# Patient Record
Sex: Female | Born: 1980 | Race: White | Hispanic: No | Marital: Married | Smoking: Former smoker
Health system: Southern US, Community
[De-identification: ages and names within clinical notes are randomized; demographics above are authoritative.]

## PROBLEM LIST (undated history)

## (undated) DIAGNOSIS — F419 Anxiety disorder, unspecified: Secondary | ICD-10-CM

## (undated) HISTORY — PX: DILATION AND CURETTAGE OF UTERUS: SHX78

## (undated) HISTORY — PX: BREAST ENHANCEMENT SURGERY: SHX7

---

## 2005-01-04 ENCOUNTER — Emergency Department (HOSPITAL_COMMUNITY): Admission: EM | Admit: 2005-01-04 | Discharge: 2005-01-04 | Payer: Self-pay | Admitting: Family Medicine

## 2005-03-08 ENCOUNTER — Inpatient Hospital Stay (HOSPITAL_COMMUNITY): Admission: AD | Admit: 2005-03-08 | Discharge: 2005-03-08 | Payer: Self-pay | Admitting: Family Medicine

## 2012-01-27 ENCOUNTER — Encounter (HOSPITAL_BASED_OUTPATIENT_CLINIC_OR_DEPARTMENT_OTHER): Payer: Self-pay | Admitting: *Deleted

## 2012-01-27 ENCOUNTER — Emergency Department (HOSPITAL_BASED_OUTPATIENT_CLINIC_OR_DEPARTMENT_OTHER)
Admission: EM | Admit: 2012-01-27 | Discharge: 2012-01-27 | Disposition: A | Discharge status: Home / Self Care | Payer: Self-pay | Attending: Emergency Medicine | Admitting: Emergency Medicine

## 2012-01-27 DIAGNOSIS — S161XXA Strain of muscle, fascia and tendon at neck level, initial encounter: Secondary | ICD-10-CM

## 2012-01-27 DIAGNOSIS — F411 Generalized anxiety disorder: Secondary | ICD-10-CM | POA: Insufficient documentation

## 2012-01-27 DIAGNOSIS — X58XXXA Exposure to other specified factors, initial encounter: Secondary | ICD-10-CM | POA: Insufficient documentation

## 2012-01-27 DIAGNOSIS — F419 Anxiety disorder, unspecified: Secondary | ICD-10-CM

## 2012-01-27 DIAGNOSIS — S139XXA Sprain of joints and ligaments of unspecified parts of neck, initial encounter: Secondary | ICD-10-CM | POA: Insufficient documentation

## 2012-01-27 NOTE — ED Provider Notes (Signed)
History     CSN: [id number]  Arrival date & time 01/27/12  1239   First MD Initiated Contact with Patient 01/27/12 1325      Chief Complaint  Patient presents with  . Shoulder Pain    starts in the L neck and goes into the L shoulder and L arm.      (Consider location/radiation/quality/duration/timing/severity/associated sxs/prior treatment) HPI Comments: Pt state that about 6 weeks ago she woke up with neck pain and was having a hard time turning her head from side to side:pt states that she went to a chiropractor multiple time and go no relief:pt states that she was seen at [facility] and had a ct of head and neck which were normal and they did lidocaine injections and oral meds on an outpt basis:pt states that after she had the injection she developed numbness in her left arm and shoulder:pt states that the numbness has resolved, but she has developed pain:pt denies fever:pt states that she has also been having anxiety problems:pt denies any need for further medication  The history is provided by the patient. No language interpreter was used.    No past medical history on file.  Past Surgical History  Procedure Date  . Breast enhancement surgery     2009    No family history on file.  History  Substance Use Topics  . Smoking status: Never Smoker   . Smokeless tobacco: Not on file  . Alcohol Use: No    OB History    Grav Para Term Preterm Abortions TAB SAB Ect Mult Living                  Review of Systems  All other systems reviewed and are negative.    Allergies  Review of patient's allergies indicates not on file.  Home Medications   Current Outpatient Rx  Name Route Sig Dispense Refill  . DIAZEPAM 5 MG PO TABS Oral Take 5 mg by mouth every 6 (six) hours as needed.    Marland Kitchen ESTRADIOL CYPIONATE 5 MG/ML IM OIL Intramuscular Inject into the muscle every 28 (twenty-eight) days.    Marland Kitchen NAPROXEN 125 MG/5ML PO SUSP Oral Take by mouth 2 (two) times daily.        BP 111/70  Pulse 108  Temp(Src) 98.2 F (36.8 C) (Oral)  Resp 17  Ht 5\' 2"  (1.575 m)  Wt 105 lb (47.628 kg)  BMI 19.20 kg/m2  SpO2 100%  Physical Exam  Nursing note and vitals reviewed. Constitutional: She is oriented to person, place, and time. She appears well-developed and well-nourished.  HENT:  Head: Normocephalic and atraumatic.  Eyes: Conjunctivae and EOM are normal.  Neck: Normal range of motion. Neck supple.  Cardiovascular: Normal rate and regular rhythm.   Pulmonary/Chest: Effort normal and breath sounds normal.  Musculoskeletal:       Pt has left paraspinal and rhomboid tenderness:grip strength is equal:no numbness noted  Neurological: She is oriented to person, place, and time. She exhibits abnormal muscle tone. Coordination normal.  Skin: Skin is warm and dry.  Psychiatric: She has a normal mood and affect.       Denies si/hi    ED Course  Procedures (including critical care time)  Labs Reviewed - No data to display No results found.   1. Cervical strain   2. Anxiety       MDM  Don't think further imaging is needed at this time:will send to ortho:pt refusing any further  medication:discussed anxiety with pt:pt states that she has a script for buspar, but she is unsure of taking the medication        Teressa Lower, NP 01/27/12 1405

## 2012-01-27 NOTE — Discharge Instructions (Signed)
Anxiety and Panic Attacks Your caregiver has informed you that you are having an anxiety or panic attack. There may be many forms of this. Most of the time these attacks come suddenly and without warning. They come at any time of day, including periods of sleep, and at any time of life. They may be strong and unexplained. Although panic attacks are very scary, they are physically harmless. Sometimes the cause of your anxiety is not known. Anxiety is a protective mechanism of the body in its fight or flight mechanism. Most of these perceived danger situations are actually nonphysical situations (such as anxiety over losing a job). CAUSES  The causes of an anxiety or panic attack are many. Panic attacks may occur in otherwise healthy people given a certain set of circumstances. There may be a genetic cause for panic attacks. Some medications may also have anxiety as a side effect. SYMPTOMS  Some of the most common feelings are:  Intense terror.   Dizziness, feeling faint.   Hot and cold flashes.   Fear of going crazy.   Feelings that nothing is real.   Sweating.   Shaking.   Chest pain or a fast heartbeat (palpitations).   Smothering, choking sensations.   Feelings of impending doom and that death is near.   Tingling of extremities, this may be from over-breathing.   Altered reality (derealization).   Being detached from yourself (depersonalization).  Several symptoms can be present to make up anxiety or panic attacks. DIAGNOSIS  The evaluation by your caregiver will depend on the type of symptoms you are experiencing. The diagnosis of anxiety or panic attack is made when no physical illness can be determined to be a cause of the symptoms. TREATMENT  Treatment to prevent anxiety and panic attacks may include:  Avoidance of circumstances that cause anxiety.   Reassurance and relaxation.   Regular exercise.   Relaxation therapies, such as yoga.   Psychotherapy with a  psychiatrist or therapist.   Avoidance of caffeine, alcohol and illegal drugs.   Prescribed medication.  SEEK IMMEDIATE MEDICAL CARE IF:   You experience panic attack symptoms that are different than your usual symptoms.   You have any worsening or concerning symptoms.  Document Released: 11/01/2005 Document Revised: 10/21/2011 Document Reviewed: 03/05/2010 [facility] Patient Information 2012 [place], [place].Cervical Sprain A cervical sprain is an injury in the neck in which the ligaments are stretched or torn. The ligaments are the tissues that hold the bones of the neck (vertebrae) in place.Cervical sprains can range from very mild to very severe. Most cervical sprains get better in 1 to 3 weeks, but it depends on the cause and extent of the injury. Severe cervical sprains can cause the neck vertebrae to be unstable. This can lead to damage of the spinal cord and can result in serious nervous system problems. Your caregiver will determine whether your cervical sprain is mild or severe. CAUSES  Severe cervical sprains may be caused by:  Contact sport injuries (football, rugby, wrestling, hockey, auto racing, gymnastics, diving, martial arts, boxing).   Motor vehicle collisions.   Whiplash injuries. This means the neck is forcefully whipped backward and forward.   Falls.  Mild cervical sprains may be caused by:   Awkward positions, such as cradling a telephone between your ear and shoulder.   Sitting in a chair that does not offer proper support.   Working at a poorly Marketing executive station.   Activities that require looking up or down for long  periods of time.  SYMPTOMS   Pain, soreness, stiffness, or a burning sensation in the front, back, or sides of the neck. This discomfort may develop immediately after injury or it may develop slowly and not begin for 24 hours or more after an injury.   Pain or tenderness directly in the middle of the back of the neck.   Shoulder or  upper back pain.   Limited ability to move the neck.   Headache.   Dizziness.   Weakness, numbness, or tingling in the hands or arms.   Muscle spasms.   Difficulty swallowing or chewing.   Tenderness and swelling of the neck.  DIAGNOSIS  Most of the time, your caregiver can diagnose this problem by taking your history and doing a physical exam. Your caregiver will ask about any known problems, such as arthritis in the neck or a previous neck injury. X-rays may be taken to find out if there are any other problems, such as problems with the bones of the neck. However, an X-ray often does not reveal the full extent of a cervical sprain. Other tests such as a computed tomography (CT) scan or magnetic resonance imaging (MRI) may be needed. TREATMENT  Treatment depends on the severity of the cervical sprain. Mild sprains can be treated with rest, keeping the neck in place (immobilization), and pain medicines. Severe cervical sprains need immediate immobilization and an appointment with an orthopedist or neurosurgeon. Several treatment options are available to help with pain, muscle spasms, and other symptoms. Your caregiver may prescribe:  Medicines, such as pain relievers, numbing medicines, or muscle relaxants.   Physical therapy. This can include stretching exercises, strengthening exercises, and posture training. Exercises and improved posture can help stabilize the neck, strengthen muscles, and help stop symptoms from returning.   A neck collar to be worn for short periods of time. Often, these collars are worn for comfort. However, certain collars may be worn to protect the neck and prevent further worsening of a serious cervical sprain.  HOME CARE INSTRUCTIONS   Put ice on the injured area.   Put ice in a plastic bag.   Place a towel between your skin and the bag.   Leave the ice on for 15 to 20 minutes, 3 to 4 times a day.   Only take over-the-counter or prescription medicines  for pain, discomfort, or fever as directed by your caregiver.   Keep all follow-up appointments as directed by your caregiver.   Keep all physical therapy appointments as directed by your caregiver.   If a neck collar is prescribed, wear it as directed by your caregiver.   Do not drive while wearing a neck collar.   Make any needed adjustments to your work station to promote good posture.   Avoid positions and activities that make your symptoms worse.   Warm up and stretch before being active to help prevent problems.  SEEK MEDICAL CARE IF:   Your pain is not controlled with medicine.   You are unable to decrease your pain medicine over time as planned.   Your activity level is not improving as expected.  SEEK IMMEDIATE MEDICAL CARE IF:   You develop any bleeding, stomach upset, or signs of an allergic reaction to your medicine.   Your symptoms get worse.   You develop new, unexplained symptoms.   You have numbness, tingling, weakness, or paralysis in any part of your body.  MAKE SURE YOU:   Understand these instructions.   Will  watch your condition.   Will get help right away if you are not doing well or get worse.  Document Released: 08/29/2007 Document Revised: 10/21/2011 Document Reviewed: 08/04/2011 [facility] Patient Information 2012 [place], [place].

## 2012-01-27 NOTE — ED Notes (Signed)
Pt. Reports she had numbness at one time but it is now gone now just reports pain in the L arm and L neck and L shoulder.

## 2012-02-03 ENCOUNTER — Other Ambulatory Visit: Payer: Self-pay

## 2012-02-03 ENCOUNTER — Emergency Department (INDEPENDENT_AMBULATORY_CARE_PROVIDER_SITE_OTHER): Payer: Self-pay

## 2012-02-03 ENCOUNTER — Encounter (HOSPITAL_BASED_OUTPATIENT_CLINIC_OR_DEPARTMENT_OTHER): Payer: Self-pay | Admitting: *Deleted

## 2012-02-03 ENCOUNTER — Emergency Department (HOSPITAL_BASED_OUTPATIENT_CLINIC_OR_DEPARTMENT_OTHER)
Admission: EM | Admit: 2012-02-03 | Discharge: 2012-02-04 | Disposition: A | Discharge status: Home / Self Care | Payer: Self-pay | Attending: Emergency Medicine | Admitting: Emergency Medicine

## 2012-02-03 DIAGNOSIS — R071 Chest pain on breathing: Secondary | ICD-10-CM | POA: Insufficient documentation

## 2012-02-03 DIAGNOSIS — R05 Cough: Secondary | ICD-10-CM

## 2012-02-03 DIAGNOSIS — R079 Chest pain, unspecified: Secondary | ICD-10-CM

## 2012-02-03 DIAGNOSIS — R0789 Other chest pain: Secondary | ICD-10-CM

## 2012-02-03 LAB — DIFFERENTIAL
Lymphs Abs: 3.6 10*3/uL (ref 0.7–4.0)
Monocytes Absolute: 0.6 10*3/uL (ref 0.1–1.0)
Monocytes Relative: 5 % (ref 3–12)
Neutro Abs: 7.2 10*3/uL (ref 1.7–7.7)
Neutrophils Relative %: 63 % (ref 43–77)

## 2012-02-03 LAB — CBC
HCT: 39.2 % (ref 36.0–46.0)
WBC: 11.4 10*3/uL — ABNORMAL HIGH (ref 4.0–10.5)

## 2012-02-03 LAB — PREGNANCY, URINE: Preg Test, Ur: NEGATIVE

## 2012-02-03 LAB — BASIC METABOLIC PANEL
BUN: 8 mg/dL (ref 6–23)
CO2: 24 mEq/L (ref 19–32)
Calcium: 9.9 mg/dL (ref 8.4–10.5)
Chloride: 105 mEq/L (ref 96–112)
GFR calc non Af Amer: >90 mL/min (ref >90)
Glucose, Bld: 99 mg/dL (ref 70–99)
Potassium: 3.3 mEq/L — ABNORMAL LOW (ref 3.5–5.1)

## 2012-02-03 NOTE — ED Notes (Signed)
Dr Nicanor Alcon at bedside.

## 2012-02-03 NOTE — ED Notes (Signed)
C/o chest pain this afternoon while walking. Pain worsens with movement and when she lays flat. Denies SOB.

## 2012-02-03 NOTE — ED Notes (Signed)
Pt c/o cold/cough symptoms xseveral days. C/o severe episodes of coughing yesterday. Today began having midsternal chest pains that worsen with coughing, movement and stretching. Denies meds PTA. Denies associated symptoms. Pt does not appear to be in any acute distress while at rest. Pt states her pain worsened during exam while taking a deep breathing and stretching.

## 2012-02-04 MED ORDER — IBUPROFEN 600 MG PO TABS: 600.0000 mg | ORAL_TABLET | Freq: Four times a day (QID) | ORAL | Status: AC | PRN

## 2012-02-04 NOTE — ED Provider Notes (Signed)
History     CSN: [id number]  Arrival date & time 02/03/12  1955   First MD Initiated Contact with Patient 02/03/12 2256      Chief Complaint  Patient presents with  . Chest Pain    (Consider location/radiation/quality/duration/timing/severity/associated sxs/prior treatment) Patient is a 31 y.o. female presenting with chest pain. The history is provided by the patient. No language interpreter was used.  Chest Pain The chest pain began 6 - 12 hours ago. Chest pain occurs constantly. The chest pain is unchanged. The pain is associated with coughing. At its most intense, the pain is at 9/10. The quality of the pain is described as pleuritic. The pain radiates to the upper back. Chest pain is worsened by certain positions. Primary symptoms include cough. Pertinent negatives for primary symptoms include no fever, no wheezing, no nausea and no vomiting.  Pertinent negatives for associated symptoms include no claudication, no diaphoresis, no lower extremity edema and no near-syncope. She tried nothing for the symptoms. Risk factors include no known risk factors.  Pertinent negatives for past medical history include no MI.  Procedure history is negative for cardiac catheterization.     History reviewed. No pertinent past medical history.  Past Surgical History  Procedure Date  . Breast enhancement surgery     2009    History reviewed. No pertinent family history.  History  Substance Use Topics  . Smoking status: Never Smoker   . Smokeless tobacco: Not on file  . Alcohol Use: No    OB History    Grav Para Term Preterm Abortions TAB SAB Ect Mult Living                  Review of Systems  Constitutional: Negative for fever and diaphoresis.  HENT: Negative.   Eyes: Negative.   Respiratory: Positive for cough. Negative for wheezing.   Cardiovascular: Positive for chest pain. Negative for claudication and near-syncope.  Gastrointestinal: Negative for nausea and vomiting.    Genitourinary: Negative.   Musculoskeletal: Negative.   Neurological: Negative.   Hematological: Negative.   Psychiatric/Behavioral: Negative.     Allergies  Review of patient's allergies indicates no known allergies.  Home Medications   Current Outpatient Rx  Name Route Sig Dispense Refill  . BUSPIRONE HCL 5 MG PO TABS Oral Take 5 mg by mouth 3 (three) times daily. Patient takes 2.5 mg in the am, and 2.5 mg in the pm.  Patient called physician and the doctor told her to discontinue the medication today.    Marland Kitchen ESTRADIOL CYPIONATE 5 MG/ML IM OIL Intramuscular Inject into the muscle every 28 (twenty-eight) days.    Marland Kitchen DIAZEPAM 5 MG PO TABS Oral Take 5 mg by mouth every 6 (six) hours as needed.    . IBUPROFEN 600 MG PO TABS Oral Take 1 tablet (600 mg total) by mouth every 6 (six) hours as needed for pain. 30 tablet 0  . NAPROXEN 125 MG/5ML PO SUSP Oral Take by mouth 2 (two) times daily.      BP 109/71  Pulse 78  Temp(Src) 98.2 F (36.8 C) (Oral)  Resp 18  Ht 5\' 2"  (1.575 m)  Wt 105 lb (47.628 kg)  BMI 19.20 kg/m2  SpO2 100%  Physical Exam  Constitutional: She is oriented to person, place, and time. She appears well-developed and well-nourished.  HENT:  Head: Normocephalic and atraumatic.  Mouth/Throat: Oropharynx is clear and moist.  Eyes: Conjunctivae are normal. Pupils are equal, round, and reactive to light.  Neck: Normal range of motion. Neck supple.  Cardiovascular: Normal rate and regular rhythm.   Pulmonary/Chest: Effort normal and breath sounds normal. She has no wheezes.  Abdominal: Soft. Bowel sounds are normal. There is no tenderness. There is no rebound and no guarding.  Musculoskeletal: Normal range of motion.  Neurological: She is alert and oriented to person, place, and time.  Skin: Skin is warm and dry. She is not diaphoretic.  Psychiatric: She has a normal mood and affect.    ED Course  Procedures (including critical care time)  Labs Reviewed  CBC -  Abnormal; Notable for the following:    WBC 11.4 (*)    All other components within normal limits  BASIC METABOLIC PANEL - Abnormal; Notable for the following:    Potassium 3.3 (*)    All other components within normal limits  PREGNANCY, URINE  DIFFERENTIAL  CARDIAC PANEL(CRET KIN+CKTOT+MB+TROPI)  D-DIMER, QUANTITATIVE   Dg Chest 2 View  02/04/2012  *RADIOLOGY REPORT*  Clinical Data: Mid chest pain; cough.  CHEST - 2 VIEW  Comparison: None.  Findings: The lungs are well-aerated.  A likely small calcified granuloma is noted at the left midlung zone.  There is no evidence of focal opacification, pleural effusion or pneumothorax.  The heart is normal in size; the mediastinal contour is within normal limits.  No acute osseous abnormalities are seen.  IMPRESSION: No acute cardiopulmonary process seen.  Original Report Authenticated By: Tonia Ghent, M.D.     1. Pain, chest wall       MDM   Date: 02/04/2012  Rate: 88  Rhythm: sinus arrhythmia  QRS Axis: normal  Intervals: normal  ST/T Wave abnormalities: nonspecific ST changes  Conduction Disutrbances:none  Narrative Interpretation:   Old EKG Reviewed: none available          Ella Golomb K Harmony Sandell-Rasch, MD 02/04/12 [phone]

## 2012-02-04 NOTE — Discharge Instructions (Signed)
Chest Pain Observation  It is often hard to give a specific diagnosis for the cause of chest pain. Your symptoms had a chance of being caused by inadequate oxygen delivery to your heart (angina). Angina that is not treated or evaluated can lead to a heart attack (myocardial infarction, MI) or death.  Blood tests, electrocardiograms, and X-rays may have been done to help determine a possible cause of your chest pain. After evaluation and observation, your caregiver has determined that it is unlikely your pain was caused by angina. However, a full evaluation of your pain needs to be completed. You need to follow up with caregivers or diagnostic testing as directed. It is very important to keep your follow-up appointments. Not keeping your follow-up appointments could result in permanent heart damage, disability, or death. If there is any problem keeping your follow-up appointments, you must call your caregiver.  HOME CARE INSTRUCTIONS   Due to the slight chance that your pain could be angina, it is important to follow healthy lifestyle habits and follow your caregiver's treatment plan:   Maintain a healthy weight.   Stay physically active and exercise regularly.   Decrease your salt intake.   Eat a diet low in saturated fats and cholesterol. Avoid foods fried in oil or made with fat. Talk to a dietician to learn about heart healthy foods.   Increase your fiber intake by including whole grains, vegetable, and fruits in your diet.   Avoid situations that cause stress, anger, or depression.   Take medication as advised by your caregiver. Report any side effects to your caregiver. Do not stop medications or adjust the dosages on your own.   Quit smoking. Do not use nicotine patches or gum until you check with your caregiver.   Keep your blood pressure, blood sugar, and cholesterol levels within normal limits.   Limit alcohol intake to no more than 1 drink per day for nonpregnant women and 2 drinks per day for  men.   Stop abusing drugs.  SEEK MEDICAL CARE IF:  You have severe chest pain or pressure which may include symptoms such as:   Pain or pressure in the arms, neck, jaw, or back.   Profuse sweating.   Feeling sick to your stomach (nauseous).   Feeling short of breath while at rest.   Having a fast or irregular heartbeat.   You have chest pain that does not get better after rest or after taking your usual medicine.   You wake from sleep with chest pain.   You feel dizzy, faint, or experience extreme fatigue.   You notice increasing shortness of breath during rest, sleep, or with activity.   You are unable to sleep because you cannot breathe.   You develop a frequent cough or you are coughing up blood.   You have severe back or abdominal pain, are nauseated, or throw up (vomit).   You develop severe weakness, dizziness, fainting, or chills.  Any of these symptoms may represent a serious problem that is an emergency. Do not wait to see if the symptoms will go away. Call your local emergency services (911 in the [place] Do not drive yourself to the hospital.  MAKE SURE YOU:   Understand these instructions.   Will watch your condition.   Will get help right away if you are not doing well or get worse.  Document Released: 12/04/2010 Document Revised: 10/21/2011 Document Reviewed: 12/04/2010  [employer] Patient Information 2012 [employer]

## 2012-02-06 ENCOUNTER — Emergency Department (INDEPENDENT_AMBULATORY_CARE_PROVIDER_SITE_OTHER): Payer: Self-pay

## 2012-02-06 ENCOUNTER — Encounter (HOSPITAL_BASED_OUTPATIENT_CLINIC_OR_DEPARTMENT_OTHER): Payer: Self-pay

## 2012-02-06 ENCOUNTER — Emergency Department (HOSPITAL_BASED_OUTPATIENT_CLINIC_OR_DEPARTMENT_OTHER)
Admission: EM | Admit: 2012-02-06 | Discharge: 2012-02-06 | Disposition: A | Discharge status: Home Health | Payer: Self-pay | Attending: Emergency Medicine | Admitting: Emergency Medicine

## 2012-02-06 DIAGNOSIS — M545 Low back pain, unspecified: Secondary | ICD-10-CM | POA: Insufficient documentation

## 2012-02-06 DIAGNOSIS — M549 Dorsalgia, unspecified: Secondary | ICD-10-CM

## 2012-02-06 DIAGNOSIS — M546 Pain in thoracic spine: Secondary | ICD-10-CM | POA: Insufficient documentation

## 2012-02-06 DIAGNOSIS — M79609 Pain in unspecified limb: Secondary | ICD-10-CM

## 2012-02-06 LAB — URINALYSIS, ROUTINE W REFLEX MICROSCOPIC
Glucose, UA: NEGATIVE mg/dL
Nitrite: NEGATIVE
Protein, ur: NEGATIVE mg/dL
Specific Gravity, Urine: 1.021 (ref 1.005–1.030)
pH: 5.5 (ref 5.0–8.0)

## 2012-02-06 LAB — URINE MICROSCOPIC-ADD ON

## 2012-02-06 MED ORDER — CYCLOBENZAPRINE HCL 10 MG PO TABS: 5.0000 mg | ORAL_TABLET | Freq: Two times a day (BID) | ORAL | Status: AC | PRN

## 2012-02-06 MED ORDER — CYCLOBENZAPRINE HCL 10 MG PO TABS
5.0000 mg | ORAL_TABLET | Freq: Once | ORAL | Status: AC
  Administered 2012-02-06: 09:00:00 via ORAL
  Filled 2012-02-06: qty 1

## 2012-02-06 NOTE — Discharge Instructions (Signed)

## 2012-02-06 NOTE — ED Provider Notes (Signed)
History     CSN: [id number]  Arrival date & time 02/06/12  4098   First MD Initiated Contact with Patient 02/06/12 [phone]      Chief Complaint  Patient presents with  . Back Pain    (Consider location/radiation/quality/duration/timing/severity/associated sxs/prior treatment) HPI Patient states that she had bilateral low back pain that began yesterday and then began having pain in her bilateral upper back. She's had no trauma. She states it was worse during the night and after she slept. Pain increases with certain movements. She has not had any increased activity or trauma. She has not short of breath or having chest pain. She's not had a nausea vomiting, diarrhea, or UTI symptoms. History reviewed. No pertinent past medical history.  Past Surgical History  Procedure Date  . Breast enhancement surgery     2009    No family history on file.  History  Substance Use Topics  . Smoking status: Never Smoker   . Smokeless tobacco: Not on file  . Alcohol Use: No    OB History    Grav Para Term Preterm Abortions TAB SAB Ect Mult Living                  Review of Systems  All other systems reviewed and are negative.    Allergies  Review of patient's allergies indicates no known allergies.  Home Medications   Current Outpatient Rx  Name Route Sig Dispense Refill  . BUSPIRONE HCL 5 MG PO TABS Oral Take 5 mg by mouth 3 (three) times daily. Patient takes 2.5 mg in the am, and 2.5 mg in the pm.  Patient called physician and the doctor told her to discontinue the medication today.    Marland Kitchen DIAZEPAM 5 MG PO TABS Oral Take 5 mg by mouth every 6 (six) hours as needed.    Marland Kitchen ESTRADIOL CYPIONATE 5 MG/ML IM OIL Intramuscular Inject into the muscle every 28 (twenty-eight) days.    . IBUPROFEN 600 MG PO TABS Oral Take 1 tablet (600 mg total) by mouth every 6 (six) hours as needed for pain. 30 tablet 0  . NAPROXEN 125 MG/5ML PO SUSP Oral Take by mouth 2 (two) times daily.      BP 128/87   Pulse 80  Temp(Src) 98.5 F (36.9 C) (Oral)  Resp 16  Ht 5\' 2"  (1.575 m)  Wt 105 lb (47.628 kg)  BMI 19.20 kg/m2  SpO2 100%  Physical Exam  Nursing note and vitals reviewed. Constitutional: She appears well-developed and well-nourished.  HENT:  Head: Normocephalic and atraumatic.  Eyes: Conjunctivae and EOM are normal. Pupils are equal, round, and reactive to light.  Neck: Normal range of motion. Neck supple.  Cardiovascular: Normal rate, regular rhythm, normal heart sounds and intact distal pulses.   Pulmonary/Chest: Effort normal and breath sounds normal.  Abdominal: Soft. Bowel sounds are normal.  Musculoskeletal: Normal range of motion.       Diffuse tenderness over trapezius and bilateral paralumbar spinal tenderness  Neurological: She is alert.  Skin: Skin is warm and dry.  Psychiatric: She has a normal mood and affect. Thought content normal.    ED Course  Procedures (including critical care time)  Labs Reviewed  URINALYSIS, ROUTINE W REFLEX MICROSCOPIC - Abnormal; Notable for the following:    Hgb urine dipstick MODERATE (*)    Ketones, ur 15 (*)    All other components within normal limits  URINE MICROSCOPIC-ADD ON - Abnormal; Notable for the following:  Bacteria, UA MANY (*)    Casts HYALINE CASTS (*)    All other components within normal limits  PREGNANCY, URINE   Dg Chest 2 View  02/06/2012  *RADIOLOGY REPORT*  Clinical Data: Upper back pain which radiates down her left arm  CHEST - 2 VIEW  Comparison: 02/03/2012  Findings: Normal cardiac silhouette and mediastinal contours.  No focal parenchymal opacities.  No pleural effusion or pneumothorax. No acute osseous abnormalities.  IMPRESSION: No acute cardiopulmonary disease.  Original Report Authenticated By: Waynard Reeds, M.D.     No diagnosis found.    MDM   Results for orders placed during the hospital encounter of 02/06/12  URINALYSIS, ROUTINE W REFLEX MICROSCOPIC      Component Value Range    Color, Urine YELLOW  YELLOW    APPearance CLEAR  CLEAR    Specific Gravity, Urine 1.021  1.005 - 1.030    pH 5.5  5.0 - 8.0    Glucose, UA NEGATIVE  NEGATIVE (mg/dL)   Hgb urine dipstick MODERATE (*) NEGATIVE    Bilirubin Urine NEGATIVE  NEGATIVE    Ketones, ur 15 (*) NEGATIVE (mg/dL)   Protein, ur NEGATIVE  NEGATIVE (mg/dL)   Urobilinogen, UA 0.2  0.0 - 1.0 (mg/dL)   Nitrite NEGATIVE  NEGATIVE    Leukocytes, UA NEGATIVE  NEGATIVE   PREGNANCY, URINE      Component Value Range   Preg Test, Ur NEGATIVE  NEGATIVE   URINE MICROSCOPIC-ADD ON      Component Value Range   Squamous Epithelial / LPF RARE  RARE    WBC, UA 0-2  <3 (WBC/hpf)   RBC / HPF 3-6  <3 (RBC/hpf)   Bacteria, UA MANY (*) RARE    Casts HYALINE CASTS (*) NEGATIVE    Urine-Other MUCOUS PRESENT     Patient with bacteria in urine but only 0-2 white blood cells and LE negative. This was a clean catch sample so I suspect this is a contaminated specimen. She'll be given a prescription for Flexeril and advised of primary care Dr.        Hilario Quarry, MD 02/06/12 1105

## 2012-02-06 NOTE — ED Notes (Signed)
Pt reports back pain that started yesterday.  Denies injury. 

## 2012-02-06 NOTE — ED Notes (Signed)
MD at bedside. 

## 2012-02-09 ENCOUNTER — Encounter (HOSPITAL_BASED_OUTPATIENT_CLINIC_OR_DEPARTMENT_OTHER): Payer: Self-pay | Admitting: *Deleted

## 2012-02-09 ENCOUNTER — Emergency Department (HOSPITAL_BASED_OUTPATIENT_CLINIC_OR_DEPARTMENT_OTHER)
Admission: EM | Admit: 2012-02-09 | Discharge: 2012-02-09 | Disposition: A | Discharge status: Home / Self Care | Payer: Self-pay | Attending: Emergency Medicine | Admitting: Emergency Medicine

## 2012-02-09 DIAGNOSIS — M542 Cervicalgia: Secondary | ICD-10-CM | POA: Insufficient documentation

## 2012-02-09 DIAGNOSIS — F411 Generalized anxiety disorder: Secondary | ICD-10-CM | POA: Insufficient documentation

## 2012-02-09 HISTORY — DX: Anxiety disorder, unspecified: F41.9

## 2012-02-09 MED ORDER — IBUPROFEN 600 MG PO TABS: 600.0000 mg | ORAL_TABLET | Freq: Four times a day (QID) | ORAL | Status: AC | PRN

## 2012-02-09 NOTE — ED Provider Notes (Signed)
History     CSN: [id number]  Arrival date & time 02/09/12  1308   First MD Initiated Contact with Patient 02/09/12 1330      Chief Complaint  Patient presents with  . Shoulder Pain    Left shoulder and L neck pain for several days per Pt.     The history is provided by the patient.  location - left neck onset - today Course - stable Worsened by - movement/palpation Improved by rest Associated symptoms - pain in left UE/hand  Pt reports pain in left neck that radiates to her left hand - reports mostly to her left 4th fingers No weakness reported No trauma reported No CP/SOB at this time No leg weakness No back pain   Past Medical History  Diagnosis Date  . Anxiety     Past Surgical History  Procedure Date  . Breast enhancement surgery     2009    No family history on file.  History  Substance Use Topics  . Smoking status: Never Smoker   . Smokeless tobacco: Not on file  . Alcohol Use: No    OB History    Grav Para Term Preterm Abortions TAB SAB Ect Mult Living                  Review of Systems  Constitutional: Negative for fever.  Respiratory: Negative for shortness of breath.     Allergies  Review of patient's allergies indicates no known allergies.  Home Medications   Current Outpatient Rx  Name Route Sig Dispense Refill  . BUSPIRONE HCL 5 MG PO TABS Oral Take 5 mg by mouth 3 (three) times daily. Patient takes 2.5 mg in the am, and 2.5 mg in the pm.  Patient called physician and the doctor told her to discontinue the medication today.    . CYCLOBENZAPRINE HCL 10 MG PO TABS Oral Take 0.5 tablets (5 mg total) by mouth 2 (two) times daily as needed for muscle spasms. 20 tablet 0  . DIAZEPAM 5 MG PO TABS Oral Take 5 mg by mouth every 6 (six) hours as needed.    Marland Kitchen ESTRADIOL CYPIONATE 5 MG/ML IM OIL Intramuscular Inject into the muscle every 28 (twenty-eight) days.    . IBUPROFEN 600 MG PO TABS Oral Take 1 tablet (600 mg total) by mouth every 6  (six) hours as needed for pain. 30 tablet 0  . IBUPROFEN 600 MG PO TABS Oral Take 1 tablet (600 mg total) by mouth every 6 (six) hours as needed for pain. 30 tablet 0  . NAPROXEN 125 MG/5ML PO SUSP Oral Take by mouth 2 (two) times daily.      BP 118/73  Pulse 99  Temp(Src) 98.4 F (36.9 C) (Oral)  Resp 16  SpO2 100%  Physical Exam CONSTITUTIONAL: Well developed/well nourished HEAD AND FACE: Normocephalic/atraumatic EYES: EOMI/PERRL ENMT: Mucous membranes moist NECK: supple no meningeal signs SPINE:entire spine nontender, cervical paraspinal tenderness CV: S1/S2 noted, no murmurs/rubs/gallops noted LUNGS: Lungs are clear to auscultation bilaterally, no apparent distress ABDOMEN: soft, nontender, no rebound or guarding GU:no cva tenderness NEURO: Pt is awake/alert, moves all extremitiesx4 Equal power with hand grip, wrist flex/extension, elbow flex/extension, and equal power with shoulder abduction/adduction.  No focal sensory deficit is noted in either UE.   Equal biceps/brachioradial reflex in bilateral UE EXTREMITIES: pulses normal, full ROM SKIN: warm, color normal PSYCH: no abnormalities of mood noted  ED Course  Procedures     1. Neck pain  MDM  Nursing notes reviewed and considered in documentation Previous records reviewed and considered  Pt well appearing, only reports pain that radiates to left Ue, no neuro deficits, ?cervical radiculopathy Pt has no active CP/SOB at this time Doubt referred abd/chest pain Stable for d/c  The patient appears reasonably screened and/or stabilized for discharge and I doubt any other medical condition or other [facility] requiring further screening, evaluation, or treatment in the ED at this time prior to discharge.         Joya Gaskins, MD 02/09/12 [phone]

## 2012-02-09 NOTE — Discharge Instructions (Signed)
You have neck pain, possibly from a cervical strain and/or pinched nerve.  ° °SEEK IMMEDIATE MEDICAL ATTENTION IF: °You develop difficulties swallowing or breathing.  °You have new or worse numbness, weakness, tingling, or movement problems in your arms or legs.  °You develop increasing pain which is uncontrolled with medications.  °You have change in bowel or bladder function, or other concerns. ° ° ° °

## 2012-02-09 NOTE — ED Notes (Signed)
Pt. Is in no distress with no s/s of shortness of breath.

## 2012-02-10 ENCOUNTER — Encounter (HOSPITAL_BASED_OUTPATIENT_CLINIC_OR_DEPARTMENT_OTHER): Payer: Self-pay | Admitting: *Deleted

## 2012-02-10 ENCOUNTER — Emergency Department (HOSPITAL_BASED_OUTPATIENT_CLINIC_OR_DEPARTMENT_OTHER)
Admission: EM | Admit: 2012-02-10 | Discharge: 2012-02-10 | Disposition: A | Discharge status: Home / Self Care | Payer: Self-pay | Attending: Emergency Medicine | Admitting: Emergency Medicine

## 2012-02-10 DIAGNOSIS — F411 Generalized anxiety disorder: Secondary | ICD-10-CM | POA: Insufficient documentation

## 2012-02-10 DIAGNOSIS — R51 Headache: Secondary | ICD-10-CM | POA: Insufficient documentation

## 2012-02-10 NOTE — ED Provider Notes (Signed)
Medical screening examination/treatment/procedure(s) were performed by non-physician practitioner and as supervising physician I was immediately available for consultation/collaboration.   Cyndra Numbers, MD 02/10/12 [phone]

## 2012-02-10 NOTE — ED Notes (Signed)
Pt. Was seen yesterday with neck pain and L shoulder pain.  Pt. Also reported she is tired and urinates frequently.  Pt reports she has nuasea at times.  No vomiting per Pt. And no diarrhea.  Pt. Is in no distress and lungs are clear bilat.

## 2012-02-10 NOTE — ED Provider Notes (Signed)
Evaluation and management procedures were performed by the PA/NP under my supervision/collaboration.   Chrisann Melaragno D Theo Reither, MD 02/10/12 1020 

## 2012-02-10 NOTE — ED Notes (Signed)
Headache on and off x 2 weeks. When she gets the pain in her head she feels tired and gets nauseated. States she has had a 10# weight loss in the past 4 weeks.

## 2012-02-10 NOTE — ED Notes (Signed)
Pt. Has her 2 children with her and also had them on yesterday.  Children are well fed and clean.  Mother does not watch children in the room and she allows children to climb and pull gloves to use as balloons.  Pt. Also on her cell phone while RN attempting to assess the Pt.

## 2012-02-10 NOTE — ED Provider Notes (Signed)
History     CSN: [id number]  Arrival date & time 02/10/12  1303   First MD Initiated Contact with Patient 02/10/12 1333      Chief Complaint  Patient presents with  . Headache    (Consider location/radiation/quality/duration/timing/severity/associated sxs/prior treatment) HPI Comments: Pt states that she has had some wt loss:pt denies any neck pain or other symptoms today  Patient is a 31 y.o. female presenting with headaches. The history is provided by the patient. No language interpreter was used.  Headache  This is a new problem. The current episode started more than 1 week ago. The problem occurs every few hours. The problem has not changed since onset.The headache is associated with eating. The pain is located in the frontal region. The quality of the pain is described as dull. The pain is at a severity of 3/10. The pain is mild. The pain does not radiate. Associated symptoms include nausea. Pertinent negatives include no fever, no orthopnea, no syncope, no shortness of breath and no vomiting.    Past Medical History  Diagnosis Date  . Anxiety     Past Surgical History  Procedure Date  . Breast enhancement surgery     2009    No family history on file.  History  Substance Use Topics  . Smoking status: Never Smoker   . Smokeless tobacco: Not on file  . Alcohol Use: No    OB History    Grav Para Term Preterm Abortions TAB SAB Ect Mult Living                  Review of Systems  Constitutional: Negative for fever.  Eyes: Negative.   Respiratory: Negative for shortness of breath.   Cardiovascular: Negative.  Negative for orthopnea and syncope.  Gastrointestinal: Positive for nausea. Negative for vomiting.  Neurological: Positive for headaches. Negative for weakness.    Allergies  Review of patient's allergies indicates no known allergies.  Home Medications   Current Outpatient Rx  Name Route Sig Dispense Refill  . BUSPIRONE HCL 5 MG PO TABS Oral Take 5  mg by mouth 3 (three) times daily. Patient takes 2.5 mg in the am, and 2.5 mg in the pm.  Patient called physician and the doctor told her to discontinue the medication today.    . CYCLOBENZAPRINE HCL 10 MG PO TABS Oral Take 0.5 tablets (5 mg total) by mouth 2 (two) times daily as needed for muscle spasms. 20 tablet 0  . DIAZEPAM 5 MG PO TABS Oral Take 5 mg by mouth every 6 (six) hours as needed.    Marland Kitchen ESTRADIOL CYPIONATE 5 MG/ML IM OIL Intramuscular Inject into the muscle every 28 (twenty-eight) days.    . IBUPROFEN 600 MG PO TABS Oral Take 1 tablet (600 mg total) by mouth every 6 (six) hours as needed for pain. 30 tablet 0  . IBUPROFEN 600 MG PO TABS Oral Take 1 tablet (600 mg total) by mouth every 6 (six) hours as needed for pain. 30 tablet 0    BP 109/68  Pulse 74  Temp(Src) 98.1 F (36.7 C) (Oral)  Resp 20  SpO2 100%  Physical Exam  Nursing note and vitals reviewed. Constitutional: She is oriented to person, place, and time. She appears well-developed and well-nourished.  HENT:  Head: Normocephalic and atraumatic.  Eyes: Conjunctivae and EOM are normal.  Neck: Neck supple.  Cardiovascular: Normal rate and regular rhythm.   Pulmonary/Chest: Effort normal and breath sounds normal.  Abdominal: Soft.  Bowel sounds are normal. There is no tenderness.  Musculoskeletal: Normal range of motion.  Neurological: She is alert and oriented to person, place, and time.  Skin: Skin is warm and dry.  Psychiatric: She has a normal mood and affect.    ED Course  Procedures (including critical care time)  Labs Reviewed - No data to display No results found.   1. Headache       MDM  Pt denies having a headache right now:pt has been seen 4 times in the last couple of weeks with different complaints:agreed with pt that she should she a pcp:pt states that she hasn't been eating because of the headaches, but she is unsure if something else is going on:pt states that she saw her ob/gyn who told  her everything was fine:pt is on dep and doesn't get periods last shot was 1 month ago:pt is okay to follow up don't think work up is needed here at this time        Teressa Lower, NP 02/10/12 1403

## 2012-02-10 NOTE — Discharge Instructions (Signed)

## 2012-02-17 ENCOUNTER — Other Ambulatory Visit: Payer: Self-pay

## 2012-02-17 ENCOUNTER — Emergency Department (HOSPITAL_BASED_OUTPATIENT_CLINIC_OR_DEPARTMENT_OTHER)
Admission: EM | Admit: 2012-02-17 | Discharge: 2012-02-17 | Disposition: A | Discharge status: Home / Self Care | Payer: Self-pay | Attending: Emergency Medicine | Admitting: Emergency Medicine

## 2012-02-17 ENCOUNTER — Emergency Department (INDEPENDENT_AMBULATORY_CARE_PROVIDER_SITE_OTHER): Payer: Self-pay

## 2012-02-17 ENCOUNTER — Encounter (HOSPITAL_BASED_OUTPATIENT_CLINIC_OR_DEPARTMENT_OTHER): Payer: Self-pay | Admitting: *Deleted

## 2012-02-17 DIAGNOSIS — Z87891 Personal history of nicotine dependence: Secondary | ICD-10-CM | POA: Insufficient documentation

## 2012-02-17 DIAGNOSIS — R05 Cough: Secondary | ICD-10-CM | POA: Insufficient documentation

## 2012-02-17 DIAGNOSIS — R0602 Shortness of breath: Secondary | ICD-10-CM

## 2012-02-17 DIAGNOSIS — R071 Chest pain on breathing: Secondary | ICD-10-CM | POA: Insufficient documentation

## 2012-02-17 DIAGNOSIS — R079 Chest pain, unspecified: Secondary | ICD-10-CM

## 2012-02-17 DIAGNOSIS — R059 Cough, unspecified: Secondary | ICD-10-CM | POA: Insufficient documentation

## 2012-02-17 DIAGNOSIS — F411 Generalized anxiety disorder: Secondary | ICD-10-CM | POA: Insufficient documentation

## 2012-02-17 DIAGNOSIS — R0789 Other chest pain: Secondary | ICD-10-CM

## 2012-02-17 LAB — BASIC METABOLIC PANEL
Chloride: 106 mEq/L (ref 96–112)
GFR calc Af Amer: >90 mL/min (ref >90)
Glucose, Bld: 105 mg/dL — ABNORMAL HIGH (ref 70–99)
Potassium: 3.3 mEq/L — ABNORMAL LOW (ref 3.5–5.1)
Sodium: 141 mEq/L (ref 135–145)

## 2012-02-17 LAB — CBC
HCT: 38.2 % (ref 36.0–46.0)
MCHC: 34 g/dL (ref 30.0–36.0)
RBC: 4.39 MIL/uL (ref 3.87–5.11)
WBC: 8 10*3/uL (ref 4.0–10.5)

## 2012-02-17 MED ORDER — KETOROLAC TROMETHAMINE 30 MG/ML IJ SOLN
30.0000 mg | Freq: Once | INTRAMUSCULAR | Status: AC
  Administered 2012-02-17: 30 mg via INTRAMUSCULAR
  Filled 2012-02-17: qty 1

## 2012-02-17 NOTE — ED Provider Notes (Signed)
History     CSN: [id number]  Arrival date & time 02/17/12  1544   First MD Initiated Contact with Patient 02/17/12 1559      Chief Complaint  Patient presents with  . Chest Pain    (Consider location/radiation/quality/duration/timing/severity/associated sxs/prior treatment) HPI Comments: Pt tried vicodin with mild relief and took valium with partial relief  Patient is a 31 y.o. female presenting with chest pain. The history is provided by the patient. No language interpreter was used.  Chest Pain The chest pain began 6 - 12 hours ago. Chest pain occurs intermittently. The chest pain is unchanged. The pain is associated with breathing. The severity of the pain is moderate. The quality of the pain is described as pleuritic. The pain radiates to the left arm. Chest pain is worsened by deep breathing and certain positions. Primary symptoms include shortness of breath and cough. Pertinent negatives for primary symptoms include no fever, no palpitations, no nausea, no vomiting and no dizziness. She tried narcotics for the symptoms.     Past Medical History  Diagnosis Date  . Anxiety     Past Surgical History  Procedure Date  . Breast enhancement surgery     2009    History reviewed. No pertinent family history.  History  Substance Use Topics  . Smoking status: Former Games developer  . Smokeless tobacco: Not on file  . Alcohol Use: No    OB History    Grav Para Term Preterm Abortions TAB SAB Ect Mult Living                  Review of Systems  Constitutional: Negative for fever.  Respiratory: Positive for cough and shortness of breath.   Cardiovascular: Positive for chest pain. Negative for palpitations.  Gastrointestinal: Negative for nausea and vomiting.  Neurological: Negative for dizziness.  All other systems reviewed and are negative.    Allergies  Review of patient's allergies indicates no known allergies.  Home Medications   Current Outpatient Rx  Name Route Sig  Dispense Refill  . CITALOPRAM HYDROBROMIDE 20 MG PO TABS Oral Take 10 mg by mouth daily.    . BUSPIRONE HCL 5 MG PO TABS Oral Take 5 mg by mouth 3 (three) times daily. Patient takes 2.5 mg in the am, and 2.5 mg in the pm.  Patient called physician and the doctor told her to discontinue the medication today.    . CYCLOBENZAPRINE HCL 10 MG PO TABS Oral Take 0.5 tablets (5 mg total) by mouth 2 (two) times daily as needed for muscle spasms. 20 tablet 0  . DIAZEPAM 5 MG PO TABS Oral Take 5 mg by mouth every 6 (six) hours as needed.    Marland Kitchen ESTRADIOL CYPIONATE 5 MG/ML IM OIL Intramuscular Inject into the muscle every 28 (twenty-eight) days.    . IBUPROFEN 600 MG PO TABS Oral Take 1 tablet (600 mg total) by mouth every 6 (six) hours as needed for pain. 30 tablet 0    Ht 5\' 2"  (1.575 m)  Wt 100 lb (45.36 kg)  BMI 18.29 kg/m2  Physical Exam  Nursing note and vitals reviewed. Constitutional: She is oriented to person, place, and time. She appears well-developed and well-nourished.  HENT:  Head: Normocephalic and atraumatic.  Eyes: Conjunctivae and EOM are normal.  Neck: Neck supple.  Cardiovascular: Normal rate and regular rhythm.   Pulmonary/Chest: Effort normal and breath sounds normal.       Pt tender in the left chest wall  Abdominal: Soft. Bowel sounds are normal. There is no tenderness.  Musculoskeletal: Normal range of motion. She exhibits no tenderness.  Neurological: She is alert and oriented to person, place, and time. She exhibits normal muscle tone. Coordination normal.  Skin: Skin is dry.  Psychiatric: She has a normal mood and affect.    ED Course  Procedures (including critical care time)  Labs Reviewed  BASIC METABOLIC PANEL - Abnormal; Notable for the following:    Potassium 3.3 (*)    Glucose, Bld 105 (*)    All other components within normal limits  CBC  D-DIMER, QUANTITATIVE   Dg Chest 2 View  02/17/2012  *RADIOLOGY REPORT*  Clinical Data: Chest pain, shortness of  breath.  CHEST - 2 VIEW  Comparison: 02/06/2012  Findings: Heart and mediastinal contours are within normal limits. No focal opacities or effusions.  No acute bony abnormality.  IMPRESSION: Normal study.  Original Report Authenticated By: Cyndie Chime, M.D.    Date: 02/17/2012  Rate: 83  Rhythm: normal sinus rhythm  QRS Axis: normal  Intervals: normal  ST/T Wave abnormalities: nonspecific ST changes  Conduction Disutrbances:none  Narrative Interpretation:   Old EKG Reviewed: unchanged    1. Chest wall pain       MDM  Pt is feeling better after the toradol        Teressa Lower, NP 02/18/12 2213

## 2012-02-17 NOTE — Discharge Instructions (Signed)
Chest Pain (Nonspecific) It is often hard to give a specific diagnosis for the cause of chest pain. There is always a chance that your pain could be related to something serious, such as a heart attack or a blood clot in the lungs. You need to follow up with your caregiver for further evaluation. CAUSES   Heartburn.   Pneumonia or bronchitis.   Anxiety or stress.   Inflammation around your heart (pericarditis) or lung (pleuritis or pleurisy).   A blood clot in the lung.   A collapsed lung (pneumothorax). It can develop suddenly on its own (spontaneous pneumothorax) or from injury (trauma) to the chest.   Shingles infection (herpes zoster virus).  The chest wall is composed of bones, muscles, and cartilage. Any of these can be the source of the pain.  The bones can be bruised by injury.   The muscles or cartilage can be strained by coughing or overwork.   The cartilage can be affected by inflammation and become sore (costochondritis).  DIAGNOSIS  Lab tests or other studies, such as X-rays, electrocardiography, stress testing, or cardiac imaging, may be needed to find the cause of your pain.  TREATMENT   Treatment depends on what may be causing your chest pain. Treatment may include:   Acid blockers for heartburn.   Anti-inflammatory medicine.   Pain medicine for inflammatory conditions.   Antibiotics if an infection is present.   You may be advised to change lifestyle habits. This includes stopping smoking and avoiding alcohol, caffeine, and chocolate.   You may be advised to keep your head raised (elevated) when sleeping. This reduces the chance of acid going backward from your stomach into your esophagus.   Most of the time, nonspecific chest pain will improve within 2 to 3 days with rest and mild pain medicine.  HOME CARE INSTRUCTIONS   If antibiotics were prescribed, take your antibiotics as directed. Finish them even if you start to feel better.   For the next few  days, avoid physical activities that bring on chest pain. Continue physical activities as directed.   Do not smoke.   Avoid drinking alcohol.   Only take over-the-counter or prescription medicine for pain, discomfort, or fever as directed by your caregiver.   Follow your caregiver's suggestions for further testing if your chest pain does not go away.   Keep any follow-up appointments you made. If you do not go to an appointment, you could develop lasting (chronic) problems with pain. If there is any problem keeping an appointment, you must call to reschedule.  SEEK MEDICAL CARE IF:   You think you are having problems from the medicine you are taking. Read your medicine instructions carefully.   Your chest pain does not go away, even after treatment.   You develop a rash with blisters on your chest.  SEEK IMMEDIATE MEDICAL CARE IF:   You have increased chest pain or pain that spreads to your arm, neck, jaw, back, or abdomen.   You develop shortness of breath, an increasing cough, or you are coughing up blood.   You have severe back or abdominal pain, feel nauseous, or vomit.   You develop severe weakness, fainting, or chills.   You have a fever.  THIS IS AN EMERGENCY. Do not wait to see if the pain will go away. Get medical help at once. Call your local emergency services (911 in [place] Do not drive yourself to the hospital. MAKE SURE YOU:   Understand these instructions.     Will watch your condition.   Will get help right away if you are not doing well or get worse.  Document Released: 08/11/2005 Document Revised: 10/21/2011 Document Reviewed: 06/06/2008 [employer] Patient Information 2012 [employer]. 

## 2012-02-17 NOTE — ED Notes (Signed)
ekg performed while pt being triaged by [facility], emt.

## 2012-02-17 NOTE — ED Notes (Signed)
Pt amb to room 1 with quick steady gait in nad. Pt reports ongoing pain in her left arm, was present this am, so she took half of an vicodin. Then took half a valium approx 30 min pta, "for my nerves". Pt states shortly after taking vicodin her arm was relieved, but her upper back was tight, and she felt sob. Pt then went to the mall with her husband and kids, and felt anxious so she took the valium. Pt states her arm pain is still present, pain also to left shoulder, and rib area.

## 2012-02-18 ENCOUNTER — Other Ambulatory Visit: Payer: Self-pay

## 2012-02-18 ENCOUNTER — Emergency Department (HOSPITAL_BASED_OUTPATIENT_CLINIC_OR_DEPARTMENT_OTHER)
Admission: EM | Admit: 2012-02-18 | Discharge: 2012-02-18 | Disposition: A | Discharge status: Home / Self Care | Payer: Self-pay | Attending: Emergency Medicine | Admitting: Emergency Medicine

## 2012-02-18 ENCOUNTER — Encounter (HOSPITAL_BASED_OUTPATIENT_CLINIC_OR_DEPARTMENT_OTHER): Payer: Self-pay | Admitting: *Deleted

## 2012-02-18 DIAGNOSIS — R0602 Shortness of breath: Secondary | ICD-10-CM | POA: Insufficient documentation

## 2012-02-18 DIAGNOSIS — F419 Anxiety disorder, unspecified: Secondary | ICD-10-CM

## 2012-02-18 DIAGNOSIS — F411 Generalized anxiety disorder: Secondary | ICD-10-CM | POA: Insufficient documentation

## 2012-02-18 MED ORDER — LORAZEPAM 1 MG PO TABS
1.0000 mg | ORAL_TABLET | Freq: Once | ORAL | Status: AC
  Administered 2012-02-18: 1 mg via ORAL
  Filled 2012-02-18: qty 1

## 2012-02-18 MED ORDER — GI COCKTAIL ~~LOC~~
30.0000 mL | Freq: Once | ORAL | Status: AC
  Administered 2012-02-18: 30 mL via ORAL
  Filled 2012-02-18: qty 30

## 2012-02-18 NOTE — ED Notes (Signed)
Pt c/o burping and burning sensation x1 week and short of breath x today. Pt sts she has lost apprx. 10lbs in 1 week.

## 2012-02-18 NOTE — ED Provider Notes (Signed)
History     CSN: [id number]  Arrival date & time 02/18/12  1756   First MD Initiated Contact with Patient 02/18/12 1809      Chief Complaint  Patient presents with  . Shortness of Breath    (Consider location/radiation/quality/duration/timing/severity/associated sxs/prior treatment) HPI Comments: Patient presents with epigastric burning and burping times one day as well as shortness of breath. She was seen yesterday for similar complaints of chest pain or shortness of breath and had a full workup of labs, d-dimer and x-ray. She denies any fever, chills, nausea, vomiting or abdominal pain. Denies leg pain or swelling. She states compliance with her anxiety medications. She does not have a PCP.  The history is provided by the patient.    Past Medical History  Diagnosis Date  . Anxiety     Past Surgical History  Procedure Date  . Breast enhancement surgery     2009    No family history on file.  History  Substance Use Topics  . Smoking status: Former Games developer  . Smokeless tobacco: Not on file  . Alcohol Use: No    OB History    Grav Para Term Preterm Abortions TAB SAB Ect Mult Living                  Review of Systems  Constitutional: Positive for fatigue. Negative for fever.  HENT: Negative for congestion and rhinorrhea.   Eyes: Negative for visual disturbance.  Respiratory: Positive for shortness of breath. Negative for cough.   Cardiovascular: Negative for chest pain.  Gastrointestinal: Negative for nausea, vomiting and abdominal pain.  Genitourinary: Negative for dysuria and vaginal discharge.  Musculoskeletal: Negative for back pain.  Skin: Negative for rash.    Allergies  Review of patient's allergies indicates no known allergies.  Home Medications   Current Outpatient Rx  Name Route Sig Dispense Refill  . BUSPIRONE HCL 5 MG PO TABS Oral Take 5 mg by mouth 3 (three) times daily. Patient takes 2.5 mg in the am, and 2.5 mg in the pm.  Patient called  physician and the doctor told her to discontinue the medication today.    Marland Kitchen CITALOPRAM HYDROBROMIDE 20 MG PO TABS Oral Take 10 mg by mouth daily.    Marland Kitchen DIAZEPAM 5 MG PO TABS Oral Take 5 mg by mouth every 6 (six) hours as needed.    Marland Kitchen ESTRADIOL CYPIONATE 5 MG/ML IM OIL Intramuscular Inject into the muscle every 28 (twenty-eight) days.    Marland Kitchen HYDROCODONE-ACETAMINOPHEN 5-500 MG PO TABS Oral Take 1 tablet by mouth every 6 (six) hours as needed. Patient used half of this medication for pain.    . IBUPROFEN 600 MG PO TABS Oral Take 1 tablet (600 mg total) by mouth every 6 (six) hours as needed for pain. 30 tablet 0    BP 115/77  Pulse 86  Temp(Src) 98.1 F (36.7 C) (Oral)  Resp 16  SpO2 100%  Physical Exam  Constitutional: She is oriented to person, place, and time. She appears well-developed and well-nourished. No distress.       Anxious, rapid speech  HENT:  Head: Normocephalic and atraumatic.  Mouth/Throat: Oropharynx is clear and moist. No oropharyngeal exudate.  Eyes: Conjunctivae are normal. Pupils are equal, round, and reactive to light.  Neck: Normal range of motion. Neck supple.  Cardiovascular: Normal rate and regular rhythm.   No murmur heard. Pulmonary/Chest: Effort normal and breath sounds normal. No respiratory distress. She has no wheezes.  Abdominal: Soft.  There is no tenderness. There is no rebound and no guarding.  Musculoskeletal: Normal range of motion. She exhibits no edema and no tenderness.  Neurological: She is alert and oriented to person, place, and time. No cranial nerve deficit.  Skin: Skin is warm.    ED Course  Procedures (including critical care time)  Labs Reviewed - No data to display Dg Chest 2 View  02/17/2012  *RADIOLOGY REPORT*  Clinical Data: Chest pain, shortness of breath.  CHEST - 2 VIEW  Comparison: 02/06/2012  Findings: Heart and mediastinal contours are within normal limits. No focal opacities or effusions.  No acute bony abnormality.   IMPRESSION: Normal study.  Original Report Authenticated By: Cyndie Chime, M.D.     No diagnosis found.    MDM  Burping sensation with shortness of breath. Vital signs stable. Lungs clear. Full workup yesterday including normal labs, negative d-dimer, negative chest x-ray. No additional testing indicated.  Patient with multiple recent visits for vague nonspecific complaints.  Suspect anxiety is component of symptoms.  Emphasized the importance of establishing care with PCP.   Date: 02/18/2012  Rate: 76  Rhythm: normal sinus rhythm  QRS Axis: normal  Intervals: normal  ST/T Wave abnormalities: normal  Conduction Disutrbances:none  Narrative Interpretation:   Old EKG Reviewed: unchanged      Glynn Octave, MD 02/18/12 [phone]

## 2012-02-18 NOTE — Discharge Instructions (Signed)
Anxiety and Panic Attacks Your caregiver has informed you that you are having an anxiety or panic attack. There may be many forms of this. Most of the time these attacks come suddenly and without warning. They come at any time of day, including periods of sleep, and at any time of life. They may be strong and unexplained. Although panic attacks are very scary, they are physically harmless. Sometimes the cause of your anxiety is not known. Anxiety is a protective mechanism of the body in its fight or flight mechanism. Most of these perceived danger situations are actually nonphysical situations (such as anxiety over losing a job). CAUSES  The causes of an anxiety or panic attack are many. Panic attacks may occur in otherwise healthy people given a certain set of circumstances. There may be a genetic cause for panic attacks. Some medications may also have anxiety as a side effect. SYMPTOMS  Some of the most common feelings are:  Intense terror.   Dizziness, feeling faint.   Hot and cold flashes.   Fear of going crazy.   Feelings that nothing is real.   Sweating.   Shaking.   Chest pain or a fast heartbeat (palpitations).   Smothering, choking sensations.   Feelings of impending doom and that death is near.   Tingling of extremities, this may be from over-breathing.   Altered reality (derealization).   Being detached from yourself (depersonalization).  Several symptoms can be present to make up anxiety or panic attacks. DIAGNOSIS  The evaluation by your caregiver will depend on the type of symptoms you are experiencing. The diagnosis of anxiety or panic attack is made when no physical illness can be determined to be a cause of the symptoms. TREATMENT  Treatment to prevent anxiety and panic attacks may include:  Avoidance of circumstances that cause anxiety.   Reassurance and relaxation.   Regular exercise.   Relaxation therapies, such as yoga.   Psychotherapy with a  psychiatrist or therapist.   Avoidance of caffeine, alcohol and illegal drugs.   Prescribed medication.  SEEK IMMEDIATE MEDICAL CARE IF:   You experience panic attack symptoms that are different than your usual symptoms.   You have any worsening or concerning symptoms.  Document Released: 11/01/2005 Document Revised: 10/21/2011 Document Reviewed: 03/05/2010 [facility] Patient Information 2012 [place], [place].   RESOURCE GUIDE  Dental Problems  Patients with Medicaid: [facility] [phone] [address]                                           [phone] [address] Phone:  [phone]                                                  Phone:  [phone]  If unable to pay or uninsured, contact:  [facility] or [facility]. to become qualified for the adult dental clinic.  Chronic Pain Problems Contact [facility]  [phone] Patients need to be referred by their primary care doctor.  Insufficient Money for Medicine Contact [employer]:  call "211" or [facility] [phone].  No Primary Care Doctor Call [facility]  [phone] Other agencies that provide inexpensive medical care    [facility]  [phone]    [facility]  [phone]    [facility]  [phone]    [facility]  [phone]    [facility]  [phone]    [facility]  [phone]  Psychological Services [facility]  [phone] [facility]  [phone] [facility]   [phone] (emergency services [phone])  Substance Abuse Resources [facility]  [phone] [facility] [phone] [facility] [phone] Floyd[facility] Flock [phone] Residential & Outpatient Substance Abuse Program  [phone]  Abuse/Neglect [facility] Child Abuse Hotline 361-469-7486 [facility] Child Abuse Hotline 340-560-8615  (After Hours)  Emergency Shelter [facility] [phone]  Maternity Homes [facility] at the [place] [facility] [phone] [facility] [phone]  MRSA Hotline #:   (551) 441-3825    [facility] Resources  [facility]     [employer]                          [facility] Dept. [address] [place]                       [address]      [address]  Blondell Reveal Phone:  [phone]                                   Phone:  [phone]                 Phone:  [phone]  [facility] Mental Health Phone:  [phone]  [facility] Child Abuse Hotline 727-419-9216 [phone] (After Hours)

## 2012-02-19 NOTE — ED Provider Notes (Signed)
Medical screening examination/treatment/procedure(s) were performed by non-physician practitioner and as supervising physician I was immediately available for consultation/collaboration.   Nat Christen, MD 02/19/12 [phone]

## 2012-08-31 ENCOUNTER — Encounter (HOSPITAL_BASED_OUTPATIENT_CLINIC_OR_DEPARTMENT_OTHER): Payer: Self-pay | Admitting: Emergency Medicine

## 2012-08-31 ENCOUNTER — Emergency Department (HOSPITAL_BASED_OUTPATIENT_CLINIC_OR_DEPARTMENT_OTHER)
Admission: EM | Admit: 2012-08-31 | Discharge: 2012-08-31 | Disposition: A | Discharge status: Other Acute Inpatient Hospital | Payer: Medicaid Other | Attending: Emergency Medicine | Admitting: Emergency Medicine

## 2012-08-31 DIAGNOSIS — O039 Complete or unspecified spontaneous abortion without complication: Secondary | ICD-10-CM | POA: Insufficient documentation

## 2012-08-31 LAB — CBC WITH DIFFERENTIAL/PLATELET
Basophils Absolute: 0 10*3/uL (ref 0.0–0.1)
Lymphocytes Relative: 20 % (ref 12–46)
Neutro Abs: 12.3 10*3/uL — ABNORMAL HIGH (ref 1.7–7.7)
Neutrophils Relative %: 73 % (ref 43–77)
Platelets: 343 10*3/uL (ref 150–400)
RDW: 12.2 % (ref 11.5–15.5)
WBC: 16.7 10*3/uL — ABNORMAL HIGH (ref 4.0–10.5)

## 2012-08-31 MED ORDER — SODIUM CHLORIDE 0.9 % IV BOLUS (SEPSIS)
500.0000 mL | Freq: Once | INTRAVENOUS | Status: AC
  Administered 2012-08-31: 1000 mL via INTRAVENOUS

## 2012-08-31 NOTE — ED Provider Notes (Signed)
History     CSN: [id number]  Arrival date & time 08/31/12  1637   First MD Initiated Contact with Patient 08/31/12 1711      Chief Complaint  Patient presents with  . Vaginal Bleeding    (Consider location/radiation/quality/duration/timing/severity/associated sxs/prior treatment) HPI Comments: Pt presents with heavy vaginal bleeding.  States that she was dx with pregnancy Sept 1st, 2 sacs were seen, but no fetal pole.  Started having some light bleeding 2 days ago.  Yesterday, got heavy and went to [facility], had u/s showing empty uterus, bleeding had stopped.  Was sent home.  Started having very heavy bleeding this afternoon about 3pm.  Is continuing to bleed to the point that it is running down her legs.  Pt denies dizziness, fatigue, chest pain.  Pt is P5, 3 children, 1 elective ab.  Sees Dr. Alyce Pagan in [facility].  Patient is a 31 y.o. female presenting with vaginal bleeding. The history is provided by the patient.  Vaginal Bleeding Pertinent negatives include no chest pain, no abdominal pain, no headaches and no shortness of breath.    Past Medical History  Diagnosis Date  . Anxiety     Past Surgical History  Procedure Date  . Breast enhancement surgery     2009    No family history on file.  History  Substance Use Topics  . Smoking status: Former Games developer  . Smokeless tobacco: Not on file  . Alcohol Use: No    OB History    Grav Para Term Preterm Abortions TAB SAB Ect Mult Living                  Review of Systems  Constitutional: Negative for fever, chills, diaphoresis and fatigue.  HENT: Negative for congestion, rhinorrhea and sneezing.   Eyes: Negative.   Respiratory: Negative for cough, chest tightness and shortness of breath.   Cardiovascular: Negative for chest pain and leg swelling.  Gastrointestinal: Negative for nausea, vomiting, abdominal pain, diarrhea and blood in stool.  Genitourinary: Positive for vaginal bleeding. Negative for frequency,  hematuria, flank pain and difficulty urinating.  Musculoskeletal: Negative for back pain and arthralgias.  Skin: Negative for rash.  Neurological: Negative for dizziness, speech difficulty, weakness, numbness and headaches.    Allergies  Review of patient's allergies indicates no known allergies.  Home Medications  No current outpatient prescriptions on file.  BP 122/94  Pulse 91  Temp 98.9 F (37.2 C) (Oral)  Resp 18  Ht 5\' 2"  (1.575 m)  Wt 110 lb (49.896 kg)  BMI 20.12 kg/m2  SpO2 99%  LMP 05/10/2012  Physical Exam  Constitutional: She is oriented to person, place, and time. She appears well-developed and well-nourished.  HENT:  Head: Normocephalic and atraumatic.  Eyes: Pupils are equal, round, and reactive to light.  Neck: Normal range of motion. Neck supple.  Cardiovascular: Normal rate, regular rhythm and normal heart sounds.   Pulmonary/Chest: Effort normal and breath sounds normal. No respiratory distress. She has no wheezes. She has no rales. She exhibits no tenderness.  Abdominal: Soft. Bowel sounds are normal. There is no tenderness. There is no rebound and no guarding.  Genitourinary:       Pt with profuse red vaginal bleeding, multiple clots in vault.  Cervix closed to fingertip.  Mild TTP over uterus  Musculoskeletal: Normal range of motion. She exhibits no edema.  Lymphadenopathy:    She has no cervical adenopathy.  Neurological: She is alert and oriented to person, place, and  time.  Skin: Skin is warm and dry. No rash noted.  Psychiatric: She has a normal mood and affect.    ED Course  Procedures (including critical care time)  Results for orders placed during the hospital encounter of 08/31/12  CBC WITH DIFFERENTIAL      Component Value Range   WBC 16.7 (*) 4.0 - 10.5 K/uL   RBC 3.36 (*) 3.87 - 5.11 MIL/uL   Hemoglobin 10.2 (*) 12.0 - 15.0 g/dL   HCT 96.0 (*) 45.4 - 09.8 %   MCV 88.4  78.0 - 100.0 fL   MCH 30.4  26.0 - 34.0 pg   MCHC 34.3  30.0 -  36.0 g/dL   RDW 11.9  14.7 - 82.9 %   Platelets 343  150 - 400 K/uL   Neutrophils Relative 73  43 - 77 %   Neutro Abs 12.3 (*) 1.7 - 7.7 K/uL   Lymphocytes Relative 20  12 - 46 %   Lymphs Abs 3.3  0.7 - 4.0 K/uL   Monocytes Relative 6  3 - 12 %   Monocytes Absolute 1.0  0.1 - 1.0 K/uL   Eosinophils Relative 1  0 - 5 %   Eosinophils Absolute 0.2  0.0 - 0.7 K/uL   Basophils Relative 0  0 - 1 %   Basophils Absolute 0.0  0.0 - 0.1 K/uL   No results found.    1. Spontaneous abortion       MDM  Pt with SAB, heavy bleeding.  IV established, saline started.  Paged Dr. Alyce Pagan  17:58 repaged Dr. Alyce Pagan, have not heard back  18:09 spoke with Dr. Alyce Pagan, will send to [facility], carelink here to transfer, will notify ER physician      Rolan Bucco, MD 08/31/12 1810

## 2012-08-31 NOTE — ED Notes (Signed)
Reports vaginal bleeding that started on Tuesday.  States it started lite then became heavy.  Patient was [redacted] weeks pregnant and was told she would most likely miscarry by her OB/Gyn on Tuesday.  Went to [facility] last night, they did [place] and was told nothing in uterus.  She states approximately 1 hour ago started bleeding extremely heavy with blood running down her legs.  She reports cramping.

## 2012-10-04 NOTE — ED Notes (Signed)
Opened this chart per patient request. She presented at registration desk with a card for a nurse named Tresa Endo due to security reasons we cant give out the last names of nurses and we have several with that name. Pt asks this RN and Stanton Kidney in registration if there is any way I can get the nurse who cared for her the card . Per pt the nurse took good care of her and she is grateful and wanted to give her a card.

## 2013-03-03 ENCOUNTER — Encounter (HOSPITAL_BASED_OUTPATIENT_CLINIC_OR_DEPARTMENT_OTHER): Payer: Self-pay | Admitting: *Deleted

## 2013-03-03 ENCOUNTER — Emergency Department (HOSPITAL_BASED_OUTPATIENT_CLINIC_OR_DEPARTMENT_OTHER)
Admission: EM | Admit: 2013-03-03 | Discharge: 2013-03-03 | Disposition: A | Discharge status: Home / Self Care | Payer: Medicaid Other | Attending: Emergency Medicine | Admitting: Emergency Medicine

## 2013-03-03 DIAGNOSIS — Z8659 Personal history of other mental and behavioral disorders: Secondary | ICD-10-CM | POA: Insufficient documentation

## 2013-03-03 DIAGNOSIS — R110 Nausea: Secondary | ICD-10-CM | POA: Insufficient documentation

## 2013-03-03 DIAGNOSIS — R197 Diarrhea, unspecified: Secondary | ICD-10-CM | POA: Insufficient documentation

## 2013-03-03 DIAGNOSIS — Z3202 Encounter for pregnancy test, result negative: Secondary | ICD-10-CM | POA: Insufficient documentation

## 2013-03-03 DIAGNOSIS — Z87891 Personal history of nicotine dependence: Secondary | ICD-10-CM | POA: Insufficient documentation

## 2013-03-03 LAB — COMPREHENSIVE METABOLIC PANEL
ALT: 13 U/L (ref 0–35)
Calcium: 9.4 mg/dL (ref 8.4–10.5)
Creatinine, Ser: 0.8 mg/dL (ref 0.50–1.10)
GFR calc Af Amer: >90 mL/min (ref >90)
GFR calc non Af Amer: >90 mL/min (ref >90)
Glucose, Bld: 107 mg/dL — ABNORMAL HIGH (ref 70–99)
Sodium: 138 mEq/L (ref 135–145)
Total Protein: 7.2 g/dL (ref 6.0–8.3)

## 2013-03-03 LAB — LIPASE, BLOOD: Lipase: 41 U/L (ref 11–59)

## 2013-03-03 LAB — CBC WITH DIFFERENTIAL/PLATELET
Eosinophils Absolute: 0.1 10*3/uL (ref 0.0–0.7)
Eosinophils Relative: 1 % (ref 0–5)
HCT: 37.8 % (ref 36.0–46.0)
Lymphs Abs: 2.9 10*3/uL (ref 0.7–4.0)
MCH: 30.2 pg (ref 26.0–34.0)
MCV: 89.2 fL (ref 78.0–100.0)
Monocytes Absolute: 0.6 10*3/uL (ref 0.1–1.0)
Platelets: 301 10*3/uL (ref 150–400)
RBC: 4.24 MIL/uL (ref 3.87–5.11)
RDW: 12.9 % (ref 11.5–15.5)

## 2013-03-03 LAB — URINALYSIS, ROUTINE W REFLEX MICROSCOPIC
Leukocytes, UA: NEGATIVE
Nitrite: NEGATIVE
Specific Gravity, Urine: 1.021 (ref 1.005–1.030)
pH: 8.5 — ABNORMAL HIGH (ref 5.0–8.0)

## 2013-03-03 LAB — URINE MICROSCOPIC-ADD ON

## 2013-03-03 MED ORDER — SODIUM CHLORIDE 0.9 % IV BOLUS (SEPSIS): 1000.0000 mL | Freq: Once | INTRAVENOUS | Status: DC

## 2013-03-03 MED ORDER — ONDANSETRON 4 MG PO TBDP: 4.0000 mg | ORAL_TABLET | Freq: Three times a day (TID) | ORAL | Status: AC | PRN

## 2013-03-03 MED ORDER — ONDANSETRON HCL 4 MG/2ML IJ SOLN
4.0000 mg | Freq: Once | INTRAMUSCULAR | Status: DC
  Filled 2013-03-03: qty 2

## 2013-03-03 NOTE — ED Notes (Signed)
Pt denies nausea at this time, deferred Zofran and fluids at this time. Yvonna Alanis, PA-C notified.

## 2013-03-03 NOTE — ED Notes (Signed)
Pt states she was in [place] and her kids got a virus, then she ?got it. Describes abd pain and constipation initially, but then had diarrhea x 3 days. Decreased appetite. Came home aqnd still felt bad. Ate Monday and worked all week, but when lying down on stomach, feels nauseated and dizzy. Preg test Neg at home.

## 2013-03-03 NOTE — ED Provider Notes (Signed)
History     CSN: [id number]  Arrival date & time 03/03/13  1536   First MD Initiated Contact with Patient 03/03/13 1622      Chief Complaint  Patient presents with  . Abdominal Pain    (Consider location/radiation/quality/duration/timing/severity/associated sxs/prior treatment) HPI Comments: Patient is a 32 year old female who presents with a 2 week history of nausea. She reports gradual onset and progressive worsening of symptoms. The nausea is intermittent without known trigger. She reports occasional constipation and diarrhea for the past 2 weeks. She has taken a pregnancy test at home which is negative. Patient has not tried anything for symptoms. No aggravating/alleviating factors.    Past Medical History  Diagnosis Date  . Anxiety     Past Surgical History  Procedure Laterality Date  . Breast enhancement surgery      2009  . Dilation and curettage of uterus      History reviewed. No pertinent family history.  History  Substance Use Topics  . Smoking status: Former Games developer  . Smokeless tobacco: Not on file  . Alcohol Use: No    OB History   Grav Para Term Preterm Abortions TAB SAB Ect Mult Living                  Review of Systems  Gastrointestinal: Positive for nausea.  All other systems reviewed and are negative.    Allergies  Review of patient's allergies indicates no known allergies.  Home Medications  No current outpatient prescriptions on file.  BP 121/73  Pulse 88  Temp(Src) 98.4 F (36.9 C) (Oral)  Resp 18  Ht 5\' 2"  (1.575 m)  Wt 100 lb (45.36 kg)  BMI 18.29 kg/m2  SpO2 100%  LMP 02/12/2013  Physical Exam  Nursing note and vitals reviewed. Constitutional: She is oriented to person, place, and time. She appears well-developed and well-nourished. No distress.  HENT:  Head: Normocephalic and atraumatic.  Eyes: Conjunctivae are normal.  Neck: Normal range of motion.  Cardiovascular: Normal rate and regular rhythm.  Exam reveals no  gallop and no friction rub.   No murmur heard. Pulmonary/Chest: Effort normal and breath sounds normal. She has no wheezes. She has no rales. She exhibits no tenderness.  Abdominal: Soft. She exhibits no distension. There is no tenderness. There is no guarding.  Musculoskeletal: Normal range of motion.  Neurological: She is alert and oriented to person, place, and time. Coordination normal.  Speech is goal-oriented. Moves limbs without ataxia.   Skin: Skin is warm and dry.  Psychiatric: She has a normal mood and affect. Her behavior is normal.    ED Course  Procedures (including critical care time)  Labs Reviewed  URINALYSIS, ROUTINE W REFLEX MICROSCOPIC - Abnormal; Notable for the following:    APPearance TURBID (*)    pH 8.5 (*)    All other components within normal limits  URINE MICROSCOPIC-ADD ON - Abnormal; Notable for the following:    Bacteria, UA FEW (*)    All other components within normal limits  COMPREHENSIVE METABOLIC PANEL - Abnormal; Notable for the following:    Glucose, Bld 107 (*)    All other components within normal limits  PREGNANCY, URINE  CBC WITH DIFFERENTIAL  LIPASE, BLOOD   No results found.   1. Nausea       MDM  5:31 PM Labs unremarkable. Patient will be discharged with zofran for nausea. Patient will have a follow up with her PCP. Vitals stable and patient afebrile.  Patient instructed to return with worsening or concerning symptoms.        Emilia Beck, PA-C 03/03/13 1736

## 2013-03-04 NOTE — ED Provider Notes (Signed)
Medical screening examination/treatment/procedure(s) were performed by non-physician practitioner and as supervising physician I was immediately available for consultation/collaboration.   Dione Booze, MD 03/04/13 1501

## 2013-06-03 ENCOUNTER — Emergency Department (HOSPITAL_BASED_OUTPATIENT_CLINIC_OR_DEPARTMENT_OTHER)
Admission: EM | Admit: 2013-06-03 | Discharge: 2013-06-03 | Disposition: A | Discharge status: Home / Self Care | Payer: Medicaid Other

## 2013-06-03 NOTE — ED Notes (Signed)
Pt states that she found out she was pregnant last week -reports approx 4 weeks. C/o "intense, sharp" left sided abdominal pain onset last week with lower back pain and vaginal bleeding this am. Pt reports a pad is not needed for the bleeding. Denies cramping.  Has an appointment on Tuesday w/ OBGYN.

## 2013-06-03 NOTE — ED Notes (Addendum)
Patient requesting Ultrasound. Pt informed that Ultrasound services were not available today and informed patient that if needed, patient could be transferred to another [facility] facility to have procedure performed if necessary following EDP's examination. Pt reports she does not wish to stay and will go to [facility] because she would like an Ultrasound. Pt LWBS prior to completing triage or exam by EDP.

## 2014-04-27 ENCOUNTER — Emergency Department (HOSPITAL_BASED_OUTPATIENT_CLINIC_OR_DEPARTMENT_OTHER)
Admission: EM | Admit: 2014-04-27 | Discharge: 2014-04-27 | Disposition: A | Discharge status: Home / Self Care | Payer: Medicaid Other | Attending: Emergency Medicine | Admitting: Emergency Medicine

## 2014-04-27 ENCOUNTER — Emergency Department (HOSPITAL_BASED_OUTPATIENT_CLINIC_OR_DEPARTMENT_OTHER): Payer: Medicaid Other

## 2014-04-27 ENCOUNTER — Encounter (HOSPITAL_BASED_OUTPATIENT_CLINIC_OR_DEPARTMENT_OTHER): Payer: Self-pay | Admitting: Emergency Medicine

## 2014-04-27 DIAGNOSIS — Z87891 Personal history of nicotine dependence: Secondary | ICD-10-CM | POA: Insufficient documentation

## 2014-04-27 DIAGNOSIS — Y939 Activity, unspecified: Secondary | ICD-10-CM | POA: Insufficient documentation

## 2014-04-27 DIAGNOSIS — S300XXA Contusion of lower back and pelvis, initial encounter: Secondary | ICD-10-CM

## 2014-04-27 DIAGNOSIS — Z8659 Personal history of other mental and behavioral disorders: Secondary | ICD-10-CM | POA: Insufficient documentation

## 2014-04-27 DIAGNOSIS — Y929 Unspecified place or not applicable: Secondary | ICD-10-CM | POA: Insufficient documentation

## 2014-04-27 DIAGNOSIS — Z3202 Encounter for pregnancy test, result negative: Secondary | ICD-10-CM | POA: Insufficient documentation

## 2014-04-27 DIAGNOSIS — X58XXXA Exposure to other specified factors, initial encounter: Secondary | ICD-10-CM | POA: Insufficient documentation

## 2014-04-27 LAB — PREGNANCY, URINE: Preg Test, Ur: NEGATIVE

## 2014-04-27 MED ORDER — IBUPROFEN 600 MG PO TABS: 600.0000 mg | ORAL_TABLET | Freq: Four times a day (QID) | ORAL | Status: AC | PRN

## 2014-04-27 MED ORDER — HYDROCODONE-ACETAMINOPHEN 5-325 MG PO TABS: 1.0000 | ORAL_TABLET | Freq: Four times a day (QID) | ORAL | Status: AC | PRN

## 2014-04-27 NOTE — ED Provider Notes (Signed)
CSN: [id number]     Arrival date & time 04/27/14  1951 History   First MD Initiated Contact with Patient 04/27/14 1953     Chief Complaint  Patient presents with  . Rash     (Consider location/radiation/quality/duration/timing/severity/associated sxs/prior Treatment) HPI Comments: Patient presents with pain at the gluteal cleft.  She noted a rash.  Several days ago.  That has became, painful.  She's been using triamcinolone ointment, without relief of her pain.  She has not taken any by mouth medicine for discomfort.  After discussing this with the patient.  She remembers.  She did fall, flat on her buttock.  Approximately 5 days ago, and developed the pain 3, days after.  Patient.  Stop using Depo-Provera injections in April.  She had one normal menstrual cycle after that, but none since.  There is a possibility of pregnancy  Patient is a 33 y.o. female presenting with rash. The history is provided by the patient.  Rash Location:  Ano-genital Ano-genital rash location:  Gluteal cleft Quality: painful   Pain details:    Quality:  Aching   Severity:  Moderate   Onset quality:  Gradual   Timing:  Constant   Progression:  Worsening Severity:  Moderate Onset quality:  Gradual Timing:  Constant Progression:  Worsening Chronicity:  New Worsened by:  Nothing tried Ineffective treatments:  None tried Associated symptoms: no diarrhea and no fever     Past Medical History  Diagnosis Date  . Anxiety    Past Surgical History  Procedure Laterality Date  . Breast enhancement surgery      2009  . Dilation and curettage of uterus     No family history on file. History  Substance Use Topics  . Smoking status: Former Games developermoker  . Smokeless tobacco: Not on file  . Alcohol Use: No   OB History   Grav Para Term Preterm Abortions TAB SAB Ect Mult Living                 Review of Systems  Constitutional: Negative for fever and chills.  Gastrointestinal: Negative for diarrhea and  constipation.  Genitourinary: Negative for dysuria and pelvic pain.  Skin: Positive for rash.  Neurological: Negative for weakness.  All other systems reviewed and are negative.     Allergies  Review of patient's allergies indicates no known allergies.  Home Medications   Prior to Admission medications   Medication Sig Start Date End Date Taking? Authorizing Provider  HYDROcodone-acetaminophen (NORCO/VICODIN) 5-325 MG per tablet Take 1 tablet by mouth every 6 (six) hours as needed. 04/27/14   Arman FilterGail K Semaje Kinker, NP  ibuprofen (ADVIL,MOTRIN) 600 MG tablet Take 1 tablet (600 mg total) by mouth every 6 (six) hours as needed. 04/27/14   Arman FilterGail K Tsuyako Jolley, NP  ondansetron (ZOFRAN ODT) 4 MG disintegrating tablet Take 1 tablet (4 mg total) by mouth every 8 (eight) hours as needed for nausea. 03/03/13   Kaitlyn Szekalski, PA-C   BP 133/91  Pulse 90  Temp(Src) 98.6 F (37 C) (Oral)  Resp 16  Ht 5\' 3"  (1.6 m)  Wt 105 lb (47.628 kg)  BMI 18.60 kg/m2  SpO2 100% Physical Exam  Nursing note and vitals reviewed. Constitutional: She appears well-developed and well-nourished.  HENT:  Head: Normocephalic.  Eyes: Pupils are equal, round, and reactive to light.  Neck: Normal range of motion.  Cardiovascular: Normal rate.   Pulmonary/Chest: Effort normal.  Musculoskeletal: Normal range of motion.  Back:  Neurological: She is alert.  Skin: Rash noted.    ED Course  Procedures (including critical care time) Labs Review Labs Reviewed  PREGNANCY, URINE    Imaging Review Dg Sacrum/coccyx  04/27/2014   CLINICAL DATA:  Fall.  Pain.  EXAM: SACRUM AND COCCYX - 2+ VIEW  COMPARISON:  None.  FINDINGS: SI joints are symmetric and unremarkable. No evidence of sacral or coccygeal fracture.  IMPRESSION: No acute bony abnormality.   Electronically Signed   By: Charlett NoseKevin  Dover M.D.   On: 04/27/2014 21:20     EKG Interpretation None      MDM  Rash.  The patient presented with is benign and will need no  treatment.  X-ray of the coccyx sacrum area reveals no fracture, dislocation.  She is being discharged him with a bone, bruise.  She's been giving ibuprofen on a regular basis as well as a prescription for 10 Vicodin to use for severe pain, if needed Final diagnoses:  Sacral contusion         Arman FilterGail K Kiely Cousar, NP 04/27/14 2128

## 2014-04-27 NOTE — Discharge Instructions (Signed)
X-ray does not show any fracture or dislocation

## 2014-04-27 NOTE — ED Provider Notes (Signed)
Medical screening examination/treatment/procedure(s) were performed by non-physician practitioner and as supervising physician I was immediately available for consultation/collaboration.     Raynaldo Falco, MD 04/27/14 2205 

## 2014-04-27 NOTE — ED Notes (Signed)
Pt now reports that she fell on her bottom "sometime", she "can't remember when."  XR to be done

## 2014-04-27 NOTE — ED Notes (Signed)
Possible mosquito bite on her lower back approx one week ago.  Now, after symptoms of continued itching, she has developed a severe soreness to that area.

## 2014-04-27 NOTE — ED Notes (Signed)
Reports soreness to tailbone area.

## 2015-07-25 ENCOUNTER — Other Ambulatory Visit (HOSPITAL_COMMUNITY): Payer: Self-pay | Admitting: Obstetrics and Gynecology

## 2015-07-25 DIAGNOSIS — Z3682 Encounter for antenatal screening for nuchal translucency: Secondary | ICD-10-CM

## 2015-07-25 DIAGNOSIS — Z3A13 13 weeks gestation of pregnancy: Secondary | ICD-10-CM

## 2015-08-05 ENCOUNTER — Encounter (HOSPITAL_COMMUNITY): Payer: Medicaid Other

## 2015-08-05 ENCOUNTER — Encounter (HOSPITAL_COMMUNITY): Payer: Self-pay

## 2015-08-05 ENCOUNTER — Ambulatory Visit (HOSPITAL_COMMUNITY)
Admission: RE | Admit: 2015-08-05 | Discharge: 2015-08-05 | Disposition: A | Discharge status: Home / Self Care | Payer: Medicaid Other | Source: Ambulatory Visit | Attending: Obstetrics and Gynecology | Admitting: Obstetrics and Gynecology

## 2015-08-05 ENCOUNTER — Other Ambulatory Visit (HOSPITAL_COMMUNITY): Payer: Self-pay | Admitting: Obstetrics and Gynecology

## 2015-08-05 DIAGNOSIS — Z3A13 13 weeks gestation of pregnancy: Secondary | ICD-10-CM | POA: Diagnosis not present

## 2015-08-05 DIAGNOSIS — Z3682 Encounter for antenatal screening for nuchal translucency: Secondary | ICD-10-CM

## 2015-08-05 DIAGNOSIS — O09291 Supervision of pregnancy with other poor reproductive or obstetric history, first trimester: Secondary | ICD-10-CM

## 2015-08-05 DIAGNOSIS — Z36 Encounter for antenatal screening of mother: Secondary | ICD-10-CM | POA: Insufficient documentation

## 2015-08-05 NOTE — Progress Notes (Signed)
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  The patient is a 34 y.o. [id number],HK [redacted]w[redacted]d with an EDD of 02/06/2016, by Ultrasound dating method.  In her most recent successful pregnancy, which was an NSVD in 2013, Jo Scott's infant was noted to have petechiae and a platelet count of 9.  She and her husband had platelet antigen typing and anti-platelet antibody testing.  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  She was also noted to have anti-HPA-9, but not HPA-15 antibodies at the time of that delivery.  She has had two consecutive SAB's in 2013 and 2015, but no other obstetrical issues, nor does she have any chronic medical problems.  She has no complaints at this time.  Review of Systems Pertinent items are noted in HPI.  Patient History OB History  Gravida Para Term Preterm AB SAB TAB Ectopic Multiple Living  0 2 2 0 0 0 3    # Outcome Date GA Lbr Len/2nd Weight Sex Delivery Anes PTL Lv  6 Current           5 SAB           4 SAB           3 Term           2 Term           1 Term               Past Medical History  Diagnosis Date  . Anxiety     Past Surgical History  Procedure Laterality Date  . Breast enhancement surgery      2009  . Dilation and curettage of uterus      Social History   Social History  . Marital Status: Married    Spouse Name: N/A  . Number of Children: N/A  . Years of Education: N/A   Social History Main  Topics  . Smoking status: Former Games developer  . Smokeless tobacco: None  . Alcohol Use: No  . Drug Use: No  . Sexual Activity: Yes    Birth Control/ Protection: None   Other Topics Concern  . None   Social History Narrative    Famliy History Ms. Tocco has no family history of mental retardation, birth defects, or genetic diseases.  Physical Examination Filed Vitals:   08/05/15 0842  BP: 126/73  Pulse: 113   General appearance - alert, well appearing, and in no distress  Assessment and Recommendations 1.  NAIT.  Jo Scott was already very knowledgeable about NAIT and how it is monitored and treated in subsequent pregnancies.  However, we did review the pathophysiology of the disease, the use of anti-platelet antibody testing, the possibility of confirming incompatibility via amniocentesis, and how the disorder is typically treated.  As Jo Scott's last child did not have intraventricular hemorrhage, her case qualifies as standard risk.  The typical treatment in such a case is to start IVIG 2g/kg/week starting at 20 weeks, followed by  0.5mg /kg/day at 32 weeks.  Delivery typically occursat 39 weeks via LTCS, as this has been associated with a lower risk of intraventricular hemorrhage compared to vaginal delivery.  Jo Scott would prefer to have a vaginal delivery.  If she ultimately chooses to have a vaginal delivery I recommend she have a PUBS to assess fetal platelet count at 32 weeks (or later) to determine if the fetal platelet count is above 100, otherwise an LTCS is indicated.  Jo Scott and her husband will return on Friday to have their blood drawn for NAIT testing to assess which if any antibodies Jo Scott has currently.  She will consider an amniocentesis if she has antibodies to HPA-9, as there is only a 50% change of incompatibility at this locus.  If the fetal were found to be negative for HPA-9, no special monitoring would be necessary.  I spent 30 minutes with Jo Scott  today of which 50% was face-to-face counseling.  Thank you for referring Jo Scott to the [facility].  Please do not hesitate to contact us with questions.   Rema Fendt, MD

## 2015-08-12 ENCOUNTER — Other Ambulatory Visit (HOSPITAL_COMMUNITY): Payer: Self-pay | Admitting: Obstetrics and Gynecology

## 2015-08-15 ENCOUNTER — Other Ambulatory Visit (HOSPITAL_COMMUNITY): Payer: Self-pay | Admitting: *Deleted

## 2015-08-15 ENCOUNTER — Telehealth (HOSPITAL_COMMUNITY): Payer: Self-pay | Admitting: *Deleted

## 2015-08-15 ENCOUNTER — Other Ambulatory Visit (HOSPITAL_COMMUNITY): Payer: Self-pay | Admitting: Obstetrics and Gynecology

## 2015-08-15 ENCOUNTER — Ambulatory Visit (HOSPITAL_COMMUNITY)
Admission: RE | Admit: 2015-08-15 | Discharge: 2015-08-15 | Disposition: A | Discharge status: Home / Self Care | Payer: Medicaid Other | Source: Ambulatory Visit | Attending: Obstetrics and Gynecology | Admitting: Obstetrics and Gynecology

## 2015-08-15 DIAGNOSIS — Z31438 Encounter for other genetic testing of female for procreative management: Secondary | ICD-10-CM | POA: Insufficient documentation

## 2015-08-15 NOTE — Telephone Encounter (Signed)
Pt called requesting first trimester screen results.  Pt name and DOB verified.  Results given to pt.  Pt voices understanding, no questions at this time.

## 2015-08-28 ENCOUNTER — Telehealth (HOSPITAL_COMMUNITY): Payer: Self-pay | Admitting: *Deleted

## 2015-08-28 NOTE — Telephone Encounter (Signed)
Called patient, verified by name and DOB.  Lab results sent to [facility] reviewed with Dr. Otho PerlNitsche and found to be negative. Patient had several questions and concerns regarding results.  Transferred patient to Dr. Otho PerlNitsche for further information.

## 2015-09-02 ENCOUNTER — Other Ambulatory Visit (HOSPITAL_COMMUNITY): Payer: Self-pay | Admitting: Obstetrics and Gynecology

## 2015-09-03 ENCOUNTER — Other Ambulatory Visit (HOSPITAL_COMMUNITY): Payer: Self-pay | Admitting: Maternal and Fetal Medicine

## 2015-09-16 ENCOUNTER — Encounter (HOSPITAL_COMMUNITY): Payer: Self-pay

## 2015-09-16 ENCOUNTER — Other Ambulatory Visit (HOSPITAL_COMMUNITY): Payer: Self-pay | Admitting: Obstetrics and Gynecology

## 2015-09-16 ENCOUNTER — Ambulatory Visit (HOSPITAL_COMMUNITY)
Admission: RE | Admit: 2015-09-16 | Discharge: 2015-09-16 | Disposition: A | Discharge status: Home / Self Care | Payer: Medicaid Other | Source: Ambulatory Visit | Attending: Obstetrics and Gynecology | Admitting: Obstetrics and Gynecology

## 2015-09-16 DIAGNOSIS — Z3A19 19 weeks gestation of pregnancy: Secondary | ICD-10-CM | POA: Diagnosis not present

## 2015-09-16 DIAGNOSIS — Z1389 Encounter for screening for other disorder: Secondary | ICD-10-CM

## 2015-09-16 DIAGNOSIS — Z36 Encounter for antenatal screening of mother: Secondary | ICD-10-CM | POA: Diagnosis not present

## 2015-09-16 DIAGNOSIS — O09299 Supervision of pregnancy with other poor reproductive or obstetric history, unspecified trimester: Secondary | ICD-10-CM | POA: Insufficient documentation

## 2015-09-24 ENCOUNTER — Ambulatory Visit (HOSPITAL_COMMUNITY)
Admission: RE | Admit: 2015-09-24 | Discharge: 2015-09-24 | Disposition: A | Discharge status: Home / Self Care | Payer: Medicaid Other | Source: Ambulatory Visit | Attending: Obstetrics and Gynecology | Admitting: Obstetrics and Gynecology

## 2015-09-24 DIAGNOSIS — Z3A20 20 weeks gestation of pregnancy: Secondary | ICD-10-CM | POA: Diagnosis not present

## 2015-09-24 DIAGNOSIS — O09299 Supervision of pregnancy with other poor reproductive or obstetric history, unspecified trimester: Secondary | ICD-10-CM | POA: Insufficient documentation

## 2015-09-24 LAB — AP-AFP (ALPHA FETOPROTEIN)

## 2015-09-24 LAB — ROUTINE CHROMOSOME - KARYOTYPE

## 2015-10-02 ENCOUNTER — Encounter (HOSPITAL_COMMUNITY): Payer: Self-pay

## 2015-10-03 ENCOUNTER — Other Ambulatory Visit (HOSPITAL_COMMUNITY): Payer: Self-pay | Admitting: Specialist

## 2015-10-03 ENCOUNTER — Other Ambulatory Visit (HOSPITAL_COMMUNITY): Payer: Self-pay

## 2015-10-06 ENCOUNTER — Other Ambulatory Visit (HOSPITAL_COMMUNITY): Payer: Self-pay

## 2015-10-10 ENCOUNTER — Telehealth (HOSPITAL_COMMUNITY): Payer: Self-pay | Admitting: MS"

## 2015-10-10 ENCOUNTER — Other Ambulatory Visit (HOSPITAL_COMMUNITY): Payer: Self-pay

## 2015-10-10 ENCOUNTER — Telehealth (HOSPITAL_COMMUNITY): Payer: Self-pay | Admitting: *Deleted

## 2015-10-10 NOTE — Telephone Encounter (Signed)
Called Ms. Milas GainLeah B Poser regarding normal chromosome analysis from amniocentesis (46, XY).   Clydie BraunKaren Angelamarie Avakian 10/10/2015 3:32 PM

## 2015-10-10 NOTE — Telephone Encounter (Signed)
Lab results reviewed by Dr. Otho PerlNitsche on 10/03/15.  Dr. Otho PerlNitsche called pt to discuss lab results.  Lab results scanned to pt chart (see media tab).

## 2015-10-23 ENCOUNTER — Other Ambulatory Visit (HOSPITAL_COMMUNITY): Payer: Self-pay

## 2015-10-24 ENCOUNTER — Encounter (HOSPITAL_COMMUNITY): Payer: Self-pay

## 2015-10-24 ENCOUNTER — Ambulatory Visit (HOSPITAL_COMMUNITY)
Admission: RE | Admit: 2015-10-24 | Discharge: 2015-10-24 | Disposition: A | Discharge status: Home / Self Care | Payer: Medicaid Other | Source: Ambulatory Visit | Attending: Obstetrics and Gynecology | Admitting: Obstetrics and Gynecology

## 2015-10-24 DIAGNOSIS — Z3A25 25 weeks gestation of pregnancy: Secondary | ICD-10-CM | POA: Diagnosis not present

## 2015-10-24 DIAGNOSIS — O09292 Supervision of pregnancy with other poor reproductive or obstetric history, second trimester: Secondary | ICD-10-CM | POA: Diagnosis not present

## 2015-10-29 ENCOUNTER — Other Ambulatory Visit (HOSPITAL_COMMUNITY): Payer: Self-pay

## 2015-11-03 ENCOUNTER — Other Ambulatory Visit (HOSPITAL_COMMUNITY): Payer: Self-pay

## 2015-11-18 ENCOUNTER — Telehealth (HOSPITAL_COMMUNITY): Payer: Self-pay | Admitting: *Deleted

## 2015-11-18 NOTE — Telephone Encounter (Signed)
Jo RuizLeah called about her lab results from [facility].  She has not been informed of these results.  Reviewed and found to be negative.  Called patient and informed of negative lab result.

## 2015-11-21 ENCOUNTER — Ambulatory Visit (HOSPITAL_COMMUNITY)
Admission: RE | Admit: 2015-11-21 | Discharge: 2015-11-21 | Disposition: A | Discharge status: Home / Self Care | Payer: Medicaid Other | Source: Ambulatory Visit | Attending: Maternal and Fetal Medicine | Admitting: Maternal and Fetal Medicine

## 2015-11-21 DIAGNOSIS — O09299 Supervision of pregnancy with other poor reproductive or obstetric history, unspecified trimester: Secondary | ICD-10-CM | POA: Diagnosis not present

## 2015-11-28 ENCOUNTER — Ambulatory Visit (HOSPITAL_COMMUNITY)
Admission: RE | Admit: 2015-11-28 | Discharge: 2015-11-28 | Disposition: A | Discharge status: Home / Self Care | Payer: Medicaid Other | Source: Ambulatory Visit | Attending: Maternal and Fetal Medicine | Admitting: Maternal and Fetal Medicine

## 2015-12-03 ENCOUNTER — Other Ambulatory Visit (HOSPITAL_COMMUNITY): Payer: Self-pay

## 2015-12-09 ENCOUNTER — Other Ambulatory Visit (HOSPITAL_COMMUNITY): Payer: Self-pay

## 2015-12-25 ENCOUNTER — Other Ambulatory Visit (HOSPITAL_COMMUNITY): Payer: Medicaid Other

## 2015-12-25 ENCOUNTER — Ambulatory Visit (HOSPITAL_COMMUNITY)
Admission: RE | Admit: 2015-12-25 | Discharge: 2015-12-25 | Disposition: A | Discharge status: Home / Self Care | Payer: Medicaid Other | Source: Ambulatory Visit | Attending: Maternal and Fetal Medicine | Admitting: Maternal and Fetal Medicine

## 2015-12-25 DIAGNOSIS — Z3A25 25 weeks gestation of pregnancy: Secondary | ICD-10-CM | POA: Diagnosis not present

## 2015-12-25 DIAGNOSIS — O09292 Supervision of pregnancy with other poor reproductive or obstetric history, second trimester: Secondary | ICD-10-CM | POA: Diagnosis present

## 2016-01-06 ENCOUNTER — Other Ambulatory Visit (HOSPITAL_COMMUNITY): Payer: Self-pay

## 2016-06-09 ENCOUNTER — Encounter (HOSPITAL_COMMUNITY): Payer: Self-pay

## 2016-09-02 IMAGING — US US MFM AMNIOCENTESIS
1 series · 13 of 17 positions shown · non-contrast
Comparison: none

[Series 1: us mfm amniocentesis · 0.23mm/px · 17 acquisitions, 13 frames shown]
[im 1/17]
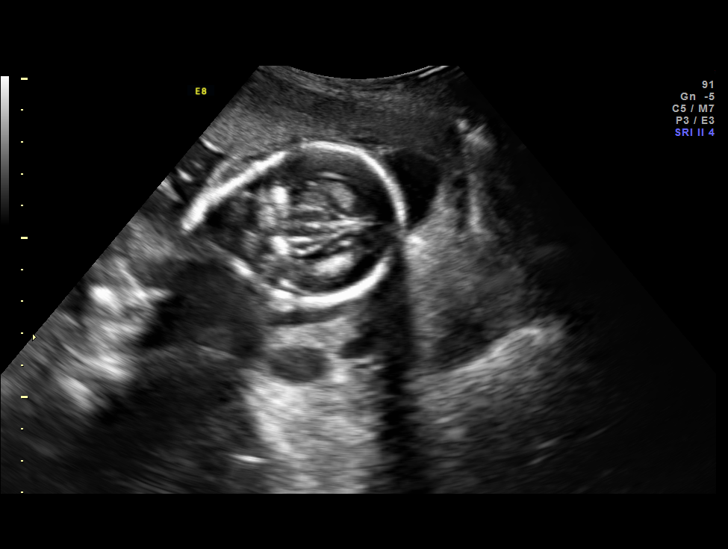
[im 2/17]
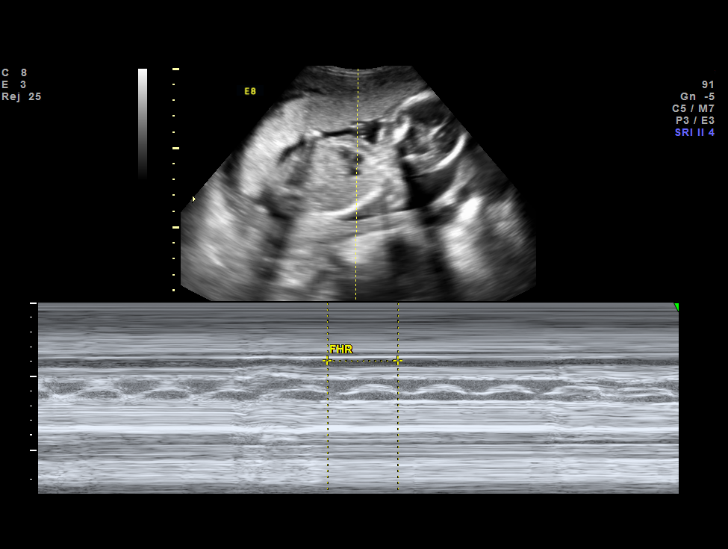
[im 4/17]
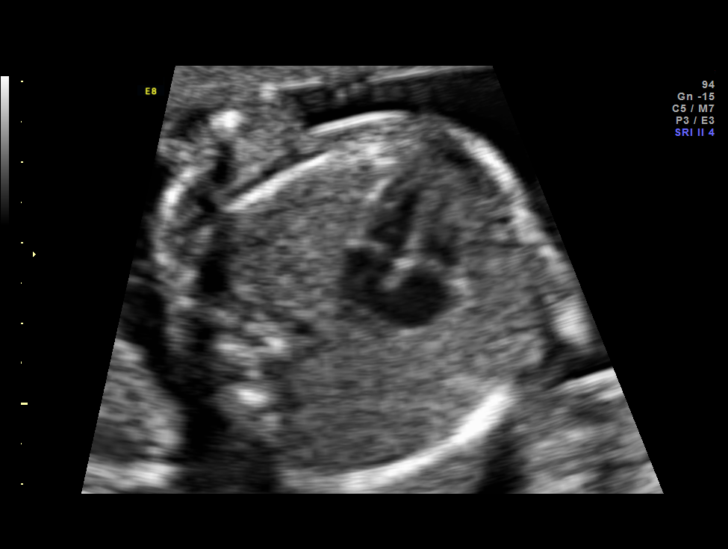
[im 5/17]
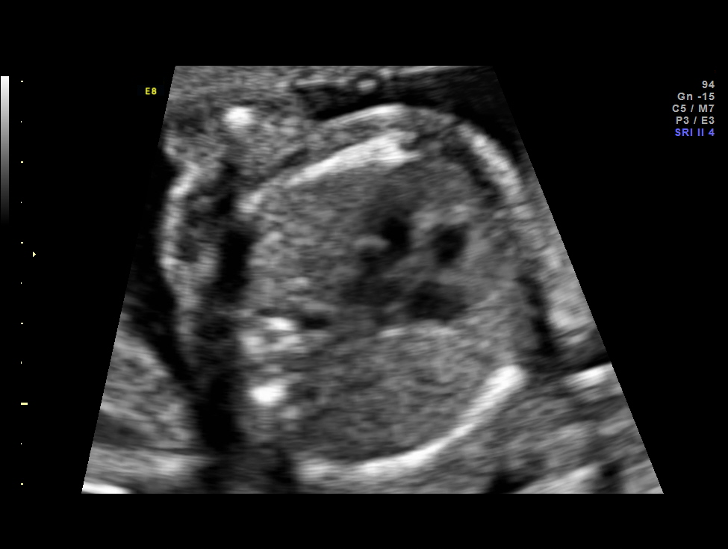
[im 6/17]
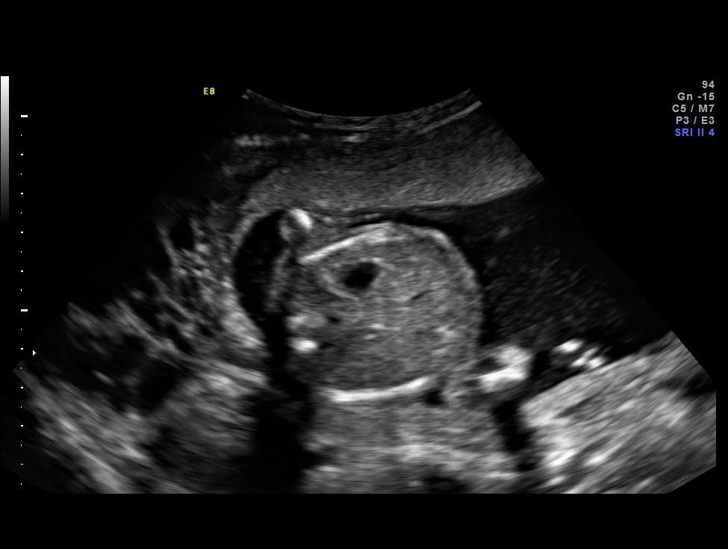
[im 8/17]
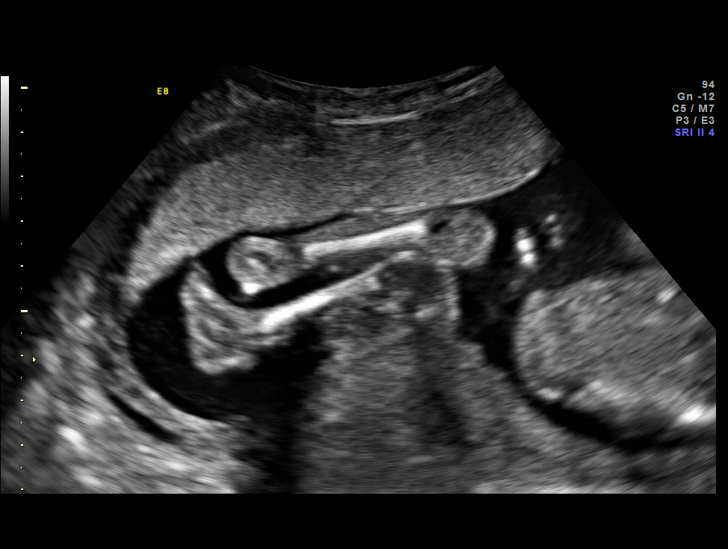
[im 9/17]
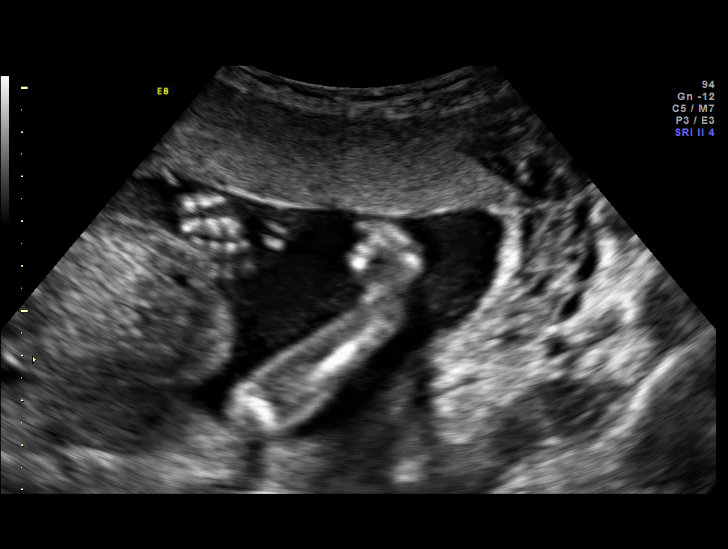
[im 10/17]
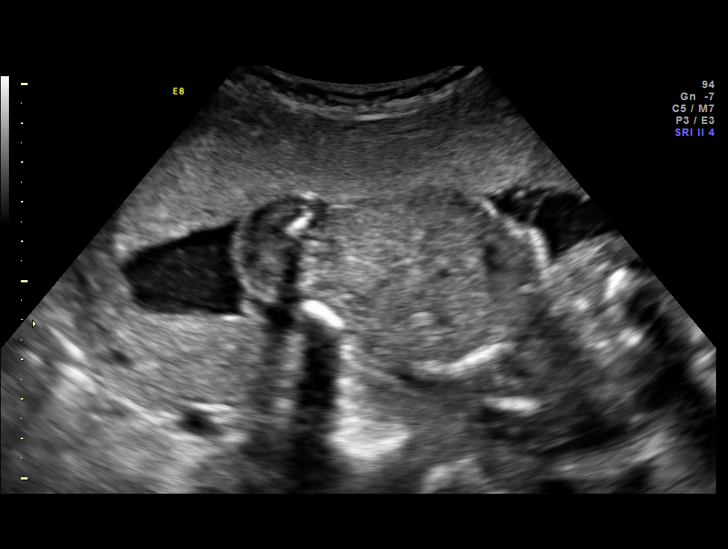
[im 12/17]
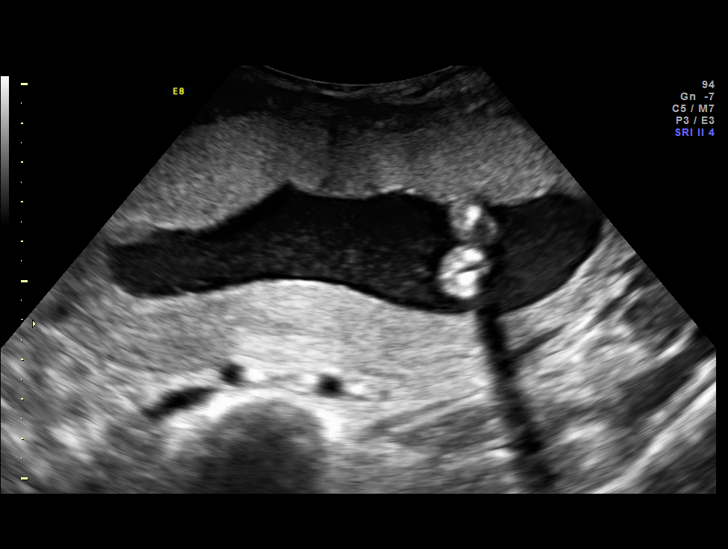
[im 13/17]
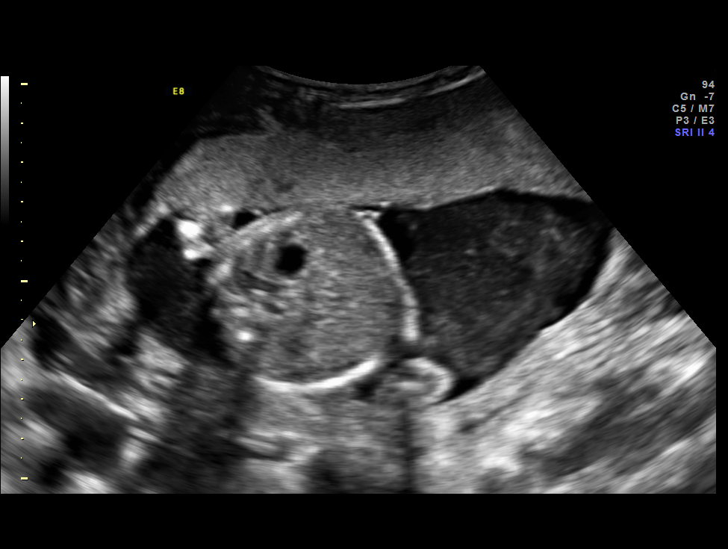
[im 14/17]
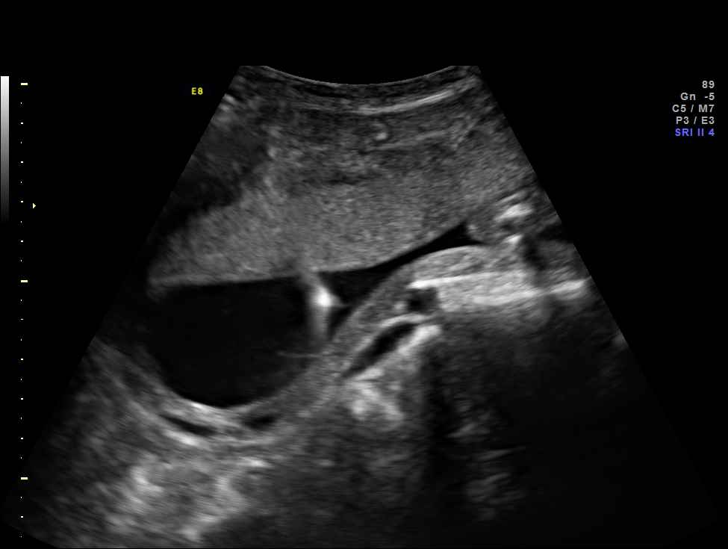
[im 16/17]
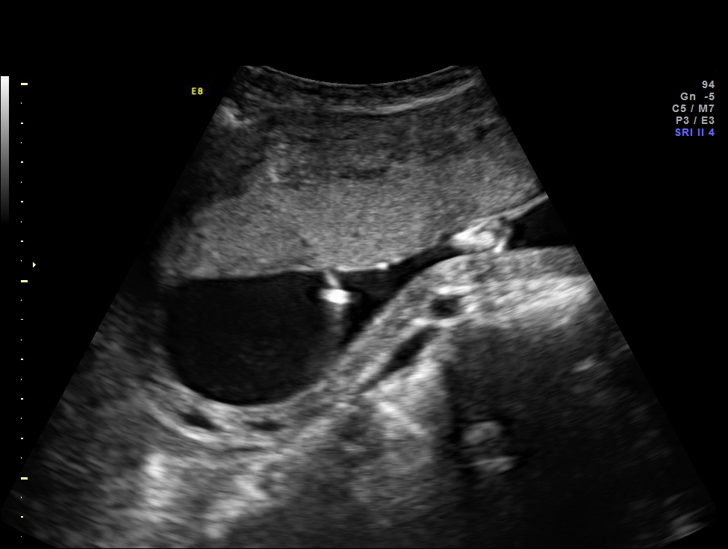
[im 17/17]
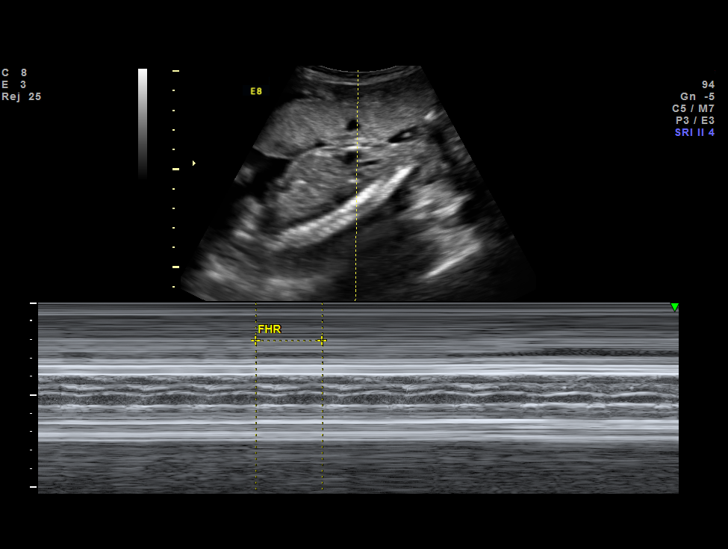

[13 of 17 positions shown; findings below may reference images not displayed]

OBSTETRICS REPORT
(Signed Final 09/24/2015 [DATE])

Name:       PIEDOSO ASPIRANTE                         Visit  09/24/2015 [DATE]
Date:

Service(s) Provided

Indications

Poor obstetrical history (previous child with
neonatal alloimmune thrombocytopenia)
Poor obstetrical history (2 SAB)
20 weeks gestation of pregnancy
Fetal Evaluation

Num Of             1
Fetuses:
Fetal Heart        142                          bpm
Rate:
Cardiac Activity:  Observed
Presentation:      Cephalic
Placenta:          Anterior, above cervical
os
P. Cord            Previously seen as
Insertion:         normal

Amniotic Fluid
AFI FV:      Subjectively within normal limits
Larg Pckt:      4.6  cm
Gestational Age

LMP:           19w 4d        Date:  05/10/15                  EDD:   02/14/16
Best:          20w 5d    Det. By:   Early Ultrasound          EDD:   02/06/16
(07/09/15)
Guided Procedures

Type:    Amniocentesis

FH Post Procedure:     Normal              RH Type:          O+
Rh Immune              Not required,       Discharge Inst.:  Post-
Globulin:              Rh positive                           procedure
instructions
given
Needle Insertions:     22 gauge x 1        Vol.              20 ml of clear
Withdrawn:        amniotic fluid
Catheter Passes:       1 pass

Complications    None
:

Comment:                     After infromed consent was obtained, amniocentesis was performed under ultrasound
guideance - single pass.   Pre procedure time out was performed. FHR post procudure 152
Comments

Ms. David Freddy was seen today for amniocentesis.  Her
previous child had petechiae and Nirmal Hinds of 9
but no intracranial bleeds.  Recent platlet antibody screen
was negative, but a platelet incompatability was noted at
YYF-H8 (heterozygous).  The reference lab felt that the
ability was limited to detect anti-HPA 9B antibodies and
recommended fetal genotype.  While other platelet
incompatabilities were noted, no platelet antibodies were
detected, but she will require continued platelet antibody
screens at 24 weeks and 32 weeks from the [facility].  Amniotic fluid volume also sent for
karyotype.
Impression

Single IUP at 20w 3d
Hx of GAJERE (see comments above)
Amniocentesis performed under ultrasound guidance as
described above.
No complications.
Recommendations

Recommend follow up ultrasound in 4 week - plan repeat
platelet antibody screen from the [facility] at that time.
If the fetal genotype is positive for YYF-H8, will need to
begin IVIG and steroids per protocol.
